# Patient Record
Sex: Female | Born: 1985 | Race: Black or African American | Hispanic: No | Marital: Married | Smoking: Never smoker
Health system: Southern US, Community
[De-identification: ages and names within clinical notes are randomized; demographics above are authoritative.]

## PROBLEM LIST (undated history)

## (undated) ENCOUNTER — Inpatient Hospital Stay (HOSPITAL_COMMUNITY): Payer: Self-pay

## (undated) ENCOUNTER — Ambulatory Visit: Source: Home / Self Care

## (undated) DIAGNOSIS — E739 Lactose intolerance, unspecified: Secondary | ICD-10-CM

## (undated) DIAGNOSIS — D689 Coagulation defect, unspecified: Secondary | ICD-10-CM

## (undated) DIAGNOSIS — E282 Polycystic ovarian syndrome: Secondary | ICD-10-CM

## (undated) DIAGNOSIS — Z91018 Allergy to other foods: Secondary | ICD-10-CM

## (undated) DIAGNOSIS — T7840XA Allergy, unspecified, initial encounter: Secondary | ICD-10-CM

## (undated) DIAGNOSIS — R0602 Shortness of breath: Secondary | ICD-10-CM

## (undated) DIAGNOSIS — A64 Unspecified sexually transmitted disease: Secondary | ICD-10-CM

## (undated) DIAGNOSIS — J45909 Unspecified asthma, uncomplicated: Secondary | ICD-10-CM

## (undated) DIAGNOSIS — M7989 Other specified soft tissue disorders: Secondary | ICD-10-CM

## (undated) DIAGNOSIS — Z789 Other specified health status: Secondary | ICD-10-CM

## (undated) DIAGNOSIS — D219 Benign neoplasm of connective and other soft tissue, unspecified: Secondary | ICD-10-CM

## (undated) DIAGNOSIS — I10 Essential (primary) hypertension: Secondary | ICD-10-CM

## (undated) DIAGNOSIS — R7303 Prediabetes: Secondary | ICD-10-CM

## (undated) DIAGNOSIS — N915 Oligomenorrhea, unspecified: Secondary | ICD-10-CM

## (undated) HISTORY — DX: Allergy to other foods: Z91.018

## (undated) HISTORY — DX: Unspecified sexually transmitted disease: A64

## (undated) HISTORY — DX: Polycystic ovarian syndrome: E28.2

## (undated) HISTORY — DX: Benign neoplasm of connective and other soft tissue, unspecified: D21.9

## (undated) HISTORY — DX: Unspecified asthma, uncomplicated: J45.909

## (undated) HISTORY — DX: Shortness of breath: R06.02

## (undated) HISTORY — DX: Oligomenorrhea, unspecified: N91.5

## (undated) HISTORY — DX: Essential (primary) hypertension: I10

## (undated) HISTORY — DX: Coagulation defect, unspecified: D68.9

## (undated) HISTORY — DX: Other specified soft tissue disorders: M79.89

## (undated) HISTORY — DX: Allergy, unspecified, initial encounter: T78.40XA

## (undated) HISTORY — PX: NO PAST SURGERIES: SHX2092

## (undated) HISTORY — DX: Lactose intolerance, unspecified: E73.9

---
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
## 2005-08-22 ENCOUNTER — Ambulatory Visit: Payer: Self-pay | Admitting: Nurse Practitioner

## 2005-11-14 ENCOUNTER — Ambulatory Visit: Payer: Self-pay | Admitting: Nurse Practitioner

## 2005-12-28 ENCOUNTER — Ambulatory Visit: Payer: Self-pay | Admitting: Nurse Practitioner

## 2006-12-24 ENCOUNTER — Emergency Department (HOSPITAL_COMMUNITY): Admission: EM | Admit: 2006-12-24 | Discharge: 2006-12-24 | Payer: Self-pay | Admitting: Family Medicine

## 2008-03-16 ENCOUNTER — Emergency Department (HOSPITAL_COMMUNITY): Admission: EM | Admit: 2008-03-16 | Discharge: 2008-03-16 | Payer: Self-pay | Admitting: Family Medicine

## 2008-12-18 ENCOUNTER — Emergency Department (HOSPITAL_COMMUNITY): Admission: EM | Admit: 2008-12-18 | Discharge: 2008-12-18 | Payer: Self-pay | Admitting: Emergency Medicine

## 2017-09-12 ENCOUNTER — Inpatient Hospital Stay (HOSPITAL_COMMUNITY)
Admission: AD | Admit: 2017-09-12 | Discharge: 2017-09-12 | Disposition: A | Discharge status: Home / Self Care | Payer: Self-pay | Source: Ambulatory Visit | Attending: Obstetrics & Gynecology | Admitting: Obstetrics & Gynecology

## 2017-09-12 ENCOUNTER — Encounter (HOSPITAL_COMMUNITY): Payer: Self-pay | Admitting: *Deleted

## 2017-09-12 ENCOUNTER — Other Ambulatory Visit: Payer: Self-pay

## 2017-09-12 DIAGNOSIS — N939 Abnormal uterine and vaginal bleeding, unspecified: Secondary | ICD-10-CM | POA: Insufficient documentation

## 2017-09-12 DIAGNOSIS — F17210 Nicotine dependence, cigarettes, uncomplicated: Secondary | ICD-10-CM | POA: Insufficient documentation

## 2017-09-12 DIAGNOSIS — Z8249 Family history of ischemic heart disease and other diseases of the circulatory system: Secondary | ICD-10-CM | POA: Insufficient documentation

## 2017-09-12 DIAGNOSIS — W57XXXA Bitten or stung by nonvenomous insect and other nonvenomous arthropods, initial encounter: Secondary | ICD-10-CM | POA: Insufficient documentation

## 2017-09-12 HISTORY — DX: Other specified health status: Z78.9

## 2017-09-12 MED ORDER — HYDROXYZINE HCL 25 MG PO TABS: 25.0000 mg | ORAL_TABLET | Freq: Four times a day (QID) | ORAL | 0 refills | Status: DC

## 2017-09-12 MED ORDER — TRIAMCINOLONE ACETONIDE 0.1 % EX CREA: 1.0000 "application " | TOPICAL_CREAM | Freq: Two times a day (BID) | CUTANEOUS | 0 refills | Status: DC

## 2017-09-12 MED ORDER — MEGESTROL ACETATE 20 MG PO TABS: 40.0000 mg | ORAL_TABLET | Freq: Two times a day (BID) | ORAL | 0 refills | Status: DC

## 2017-09-12 NOTE — MAU Note (Signed)
While she was out of town, stayed in a hotel, got bit by some bedbug, they just really itch.  On 2nd week of cycle.  No cycle Jan through April.  Neg preg test in March.  Had cycle beginning of May.  States is heavier than her usual.

## 2017-09-12 NOTE — MAU Provider Note (Signed)
History     CSN: [id number]  Arrival date and time: 09/12/17 1349   First Provider Initiated Contact with Patient 09/12/17 1435      Chief Complaint  Patient presents with  . bug bites  . irreg cycle   HPI    Ms.Diamond Thompson is a 32 y.o. non pregnant female here in MAU with complaints of abnormal vaginal  bleeding. Says her menstrual cycle started on  5/24; she says that she has been bleeding ever since. She has had one other period like this about a year ago.  Last Pap was last year and it was normal. She is not on any hormones or BC.   She has been bitten by bug bites while staying in a hotel in [place]. Says the bumps are very itching badly. She tried using OTC hydrocortisone cream which did not help at all. Says the scratching/itching is there all the time. The bites are on her arms and her back.    OB History   None     Past Medical History:  Diagnosis Date  . Medical history non-contributory     Past Surgical History:  Procedure Laterality Date  . NO PAST SURGERIES      Family History  Problem Relation Age of Onset  . Hypertension Mother   . Hypertension Maternal Grandmother   . Hypertension Paternal Grandmother     Social History   Tobacco Use  . Smoking status: Current Every Day Smoker    Types: Cigarettes  . Smokeless tobacco: Never Used  . Tobacco comment: tired   Substance Use Topics  . Alcohol use: Not Currently    Comment: social   . Drug use: Not Currently    Types: Marijuana    Comment: tried at age 32    Allergies:  Allergies  Allergen Reactions  . Kiwi Extract     No medications prior to admission.   No results found for this or any previous visit (from the past 48 hour(s)).   Review of Systems  Gastrointestinal: Negative for abdominal pain.  Genitourinary: Positive for vaginal bleeding.  Neurological: Negative for dizziness.   Physical Exam   Blood pressure 126/87, pulse 90, temperature 98.5 F (36.9 C),  temperature source Oral, resp. rate 18, weight 208 lb 8 oz (94.6 kg), last menstrual period 09/05/2017.  Physical Exam  Constitutional: She is oriented to person, place, and time. She appears well-developed and well-nourished. No distress.  Respiratory: Effort normal.  Genitourinary:  Genitourinary Comments: Vagina - Small amount of dark red blood noted in the vaginal canal Cervix - No contact bleeding, no active bleeding  Bimanual exam: Cervix closed Uterus non tender, slightly enlarged  Chaperone present for exam.   Musculoskeletal: Normal range of motion.  Neurological: She is alert and oriented to person, place, and time.  Skin: Skin is warm. She is not diaphoretic.     Multiple papular bug bites noted on right scapula and bilateral forearms. No surrounding cellulitis   Psychiatric: Her behavior is normal.   MAU Course  Procedures  None  MDM  Declines STD testing. .  Patient declines birth control pills at this time.   Assessment and Plan   A:  1. Abnormal vaginal bleeding   2. Bedbug bite, initial encounter     P:  Discharge home in stable condition Rx: Megace, triamcinolone cream, Atarax  Return to MAU for emergencies  GYN recommended  Wash all of your bedding  Shirlee Whitmire, Artist Pais, NP 09/12/2017  6:15 PM

## 2017-09-12 NOTE — Discharge Instructions (Signed)
Abnormal Uterine Bleeding Abnormal uterine bleeding can affect women at various stages in life, including teenagers, women in their reproductive years, pregnant women, and women who have reached menopause. Several kinds of uterine bleeding are considered abnormal, including:  Bleeding or spotting between periods.  Bleeding after sexual intercourse.  Bleeding that is heavier or more than normal.  Periods that last longer than usual.  Bleeding after menopause. Many cases of abnormal uterine bleeding are minor and simple to treat, while others are more serious. Any type of abnormal bleeding should be evaluated by your health care provider. Treatment will depend on the cause of the bleeding. Follow these instructions at home: Monitor your condition for any changes. The following actions may help to alleviate any discomfort you are experiencing:  Avoid the use of tampons and douches as directed by your health care provider.  Change your pads frequently. You should get regular pelvic exams and Pap tests. Keep all follow-up appointments for diagnostic tests as directed by your health care provider. Contact a health care provider if:  Your bleeding lasts more than 1 week.  You feel dizzy at times. Get help right away if:  You pass out.  You are changing pads every 15 to 30 minutes.  You have abdominal pain.  You have a fever.  You become sweaty or weak.  You are passing large blood clots from the vagina.  You start to feel nauseous and vomit. This information is not intended to replace advice given to you by your health care provider. Make sure you discuss any questions you have with your health care provider. Document Released: 03/27/2005 Document Revised: 09/08/2015 Document Reviewed: 10/24/2012 Elsevier Interactive Patient Education  2017 Elsevier Inc.  

## 2017-09-12 NOTE — Progress Notes (Addendum)
Non-pregn pt here for bleeding past week and ongoing that's heavier than usual monthly bleeding and bedbug bites that's on hands and right back shoulder area. Sunday was when she got the bedbug bites.   1535: Discharge instructions given with pt understanding. Pt left unit via ambulatory with SO.

## 2017-10-23 ENCOUNTER — Other Ambulatory Visit: Payer: Self-pay | Admitting: Family Medicine

## 2017-10-23 DIAGNOSIS — N939 Abnormal uterine and vaginal bleeding, unspecified: Secondary | ICD-10-CM

## 2017-10-24 ENCOUNTER — Ambulatory Visit
Admission: RE | Admit: 2017-10-24 | Discharge: 2017-10-24 | Disposition: A | Discharge status: Home / Self Care | Payer: Self-pay | Source: Ambulatory Visit | Attending: Family Medicine | Admitting: Family Medicine

## 2017-10-24 DIAGNOSIS — N939 Abnormal uterine and vaginal bleeding, unspecified: Secondary | ICD-10-CM

## 2018-05-23 DIAGNOSIS — Z114 Encounter for screening for human immunodeficiency virus [HIV]: Secondary | ICD-10-CM | POA: Diagnosis not present

## 2018-05-23 DIAGNOSIS — Z3202 Encounter for pregnancy test, result negative: Secondary | ICD-10-CM | POA: Diagnosis not present

## 2018-05-23 DIAGNOSIS — Z113 Encounter for screening for infections with a predominantly sexual mode of transmission: Secondary | ICD-10-CM | POA: Diagnosis not present

## 2018-10-17 ENCOUNTER — Other Ambulatory Visit: Payer: Self-pay

## 2018-10-21 ENCOUNTER — Encounter: Payer: Self-pay | Admitting: Certified Nurse Midwife

## 2018-10-21 ENCOUNTER — Other Ambulatory Visit (HOSPITAL_COMMUNITY)
Admission: RE | Admit: 2018-10-21 | Discharge: 2018-10-21 | Disposition: A | Discharge status: Home / Self Care | Payer: BC Managed Care – PPO | Source: Ambulatory Visit | Attending: Certified Nurse Midwife | Admitting: Certified Nurse Midwife

## 2018-10-21 ENCOUNTER — Ambulatory Visit (INDEPENDENT_AMBULATORY_CARE_PROVIDER_SITE_OTHER): Payer: BC Managed Care – PPO | Admitting: Certified Nurse Midwife

## 2018-10-21 ENCOUNTER — Other Ambulatory Visit: Payer: Self-pay

## 2018-10-21 VITALS — BP 118/80 | HR 70 | Temp 97.1°F | Resp 16 | Ht 60.75 in | Wt 204.0 lb

## 2018-10-21 DIAGNOSIS — Z23 Encounter for immunization: Secondary | ICD-10-CM

## 2018-10-21 DIAGNOSIS — Z124 Encounter for screening for malignant neoplasm of cervix: Secondary | ICD-10-CM | POA: Insufficient documentation

## 2018-10-21 DIAGNOSIS — Z01419 Encounter for gynecological examination (general) (routine) without abnormal findings: Secondary | ICD-10-CM

## 2018-10-21 DIAGNOSIS — N926 Irregular menstruation, unspecified: Secondary | ICD-10-CM

## 2018-10-21 DIAGNOSIS — Z86018 Personal history of other benign neoplasm: Secondary | ICD-10-CM

## 2018-10-21 DIAGNOSIS — N852 Hypertrophy of uterus: Secondary | ICD-10-CM

## 2018-10-21 LAB — HCG, SERUM, QUALITATIVE: hCG,Beta Subunit,Qual,Serum: NEGATIVE m[IU]/mL (ref <6)

## 2018-10-21 NOTE — Progress Notes (Signed)
33 y.o. G0P0000 Married  African American Fe here to establish gyn care and  for annual exam. Also has noted that her periods have been more irregular since age 49, with longest time amenorrhea 6 months.Last year periods every 2 weeks with 7 day duration with some heavy bleeding and then light to moderate. This year have been skipping months. Last period was 2 months ago. Was told she had fibroids per health department 6/ 19 at [facility]. (see imaging). Currently sexually active, no contraception 2-3 years. Their choice. UPT at home negative.  No LMP recorded. (Menstrual status: Irregular Periods).          Sexually active: Yes.    The current method of family planning is none.    Exercising: No.  exercise Smoker:  no  Review of Systems  Constitutional: Negative.   HENT: Negative.   Eyes: Negative.   Respiratory: Negative.   Cardiovascular: Negative.   Gastrointestinal: Negative.   Genitourinary: Negative.        Irregular cycle, fibroids  Musculoskeletal: Negative.   Skin: Negative.   Neurological: Negative.   Endo/Heme/Allergies: Negative.   Psychiatric/Behavioral: Negative.     Health Maintenance: Pap:  2019 neg per patient at health dept. History of Abnormal Pap: no MMG:  none Self Breast exams: no Colonoscopy:  none BMD:   none TDaP:  Longer than 10 Shingles: no Pneumonia: no Hep C and HIV: not done Labs: if needed   reports that she has never smoked. She has never used smokeless tobacco. She reports previous alcohol use. She reports previous drug use.  Past Medical History:  Diagnosis Date  . Asthma   . Fibroid   . Medical history non-contributory   . STD (sexually transmitted disease)    age 65?    Past Surgical History:  Procedure Laterality Date  . NO PAST SURGERIES      No current outpatient medications on file.   No current facility-administered medications for this visit.     Family History  Problem Relation Age of Onset  . Hypertension Mother    . Hypertension Maternal Grandmother   . Hypertension Paternal Grandmother     ROS:  Pertinent items are noted in HPI.  Otherwise, a comprehensive ROS was negative.  Exam:   BP 118/80   Pulse 70   Temp (!) 97.1 F (36.2 C) (Skin)   Resp 16   Ht 5' 0.75" (1.543 m)   Wt 204 lb (92.5 kg)   BMI 38.86 kg/m  Height: 5' 0.75" (154.3 cm) Ht Readings from Last 3 Encounters:  10/21/18 5' 0.75" (1.543 m)    General appearance: alert, cooperative and appears stated age Head: Normocephalic, without obvious abnormality, atraumatic Neck: no adenopathy, supple, symmetrical, trachea midline and thyroid normal to inspection and palpation Lungs: clear to auscultation bilaterally Breasts: normal appearance, no masses or tenderness, No nipple retraction or dimpling, No nipple discharge or bleeding, No axillary or supraclavicular adenopathy, pierced nipples bilateral Heart: regular rate and rhythm Abdomen: soft, non-tender; no masses,  no organomegaly Extremities: extremities normal, atraumatic, no cyanosis or edema Skin: Skin color, texture, turgor normal. No rashes or lesions Lymph nodes: Cervical, supraclavicular, and axillary nodes normal. No abnormal inguinal nodes palpated Neurologic: Grossly normal   Pelvic: External genitalia:  no lesions, normal female              Urethra:  normal appearing urethra with no masses, tenderness or lesions  Bartholin's and Skene's: normal                 Vagina: normal appearing vagina with normal color and discharge, no lesions              Cervix: no cervical motion tenderness, no lesions, retroverted and blood present              Pap taken: Yes.   Bimanual Exam:  Uterus:  enlarged, 8-10 week size,  weeks size              Adnexa: normal adnexa and no mass, fullness, tenderness               Rectovaginal: Confirms               Anus:  normal sphincter tone, no lesions  Chaperone present: yes  A:  Well Woman with normal  exam  Contraception none  Irregular periods with DUB  History of Fibroids see PUS 2019  R/O anemia  Immunization update  P:   Reviewed health and wellness pertinent to exam  Discussed irregular period management with OCP, Nuvaring, Nexplanon, ? IUD. Patient not sure she wants to use contraception for management of DUB, will consider. Serum HCG negative here today.  Discussed PUS report reviewed in Epic and feel due to increase in amount of bleeding, uterine possible change would recommend another PUS for  Evaluation. Would then be able to further recommend best option for her.Not planning for pregnancy. Questions addressed at length, patient aware she will be called with insurance information and scheduled.  Discussed lab to evaluate other issues with no period, which can be thyroid or pituitary, not sure she wants other labs.  Discussed concerns with anemia with heavier period and recommend lab evaluation agreeable. Encouraged to eat well with iron foods on daily basis. Warning signs of heavy bleeding discussed and need to advise if occurs. Voiced understanding.  Risks/benefits or Tdap given, requests today.  Lab: CBC  Pap smear: yes   counseled on breast self exam, feminine hygiene, family planning choices, adequate intake of calcium and vitamin D, diet and exercise  return annually or prn  An After Visit Summary was printed and given to the patient.

## 2018-10-22 ENCOUNTER — Other Ambulatory Visit: Payer: Self-pay | Admitting: *Deleted

## 2018-10-22 DIAGNOSIS — N852 Hypertrophy of uterus: Secondary | ICD-10-CM

## 2018-10-22 DIAGNOSIS — N926 Irregular menstruation, unspecified: Secondary | ICD-10-CM

## 2018-10-22 DIAGNOSIS — Z86018 Personal history of other benign neoplasm: Secondary | ICD-10-CM

## 2018-10-22 LAB — CBC
Hematocrit: 40.8 % (ref 34.0–46.6)
Hemoglobin: 12.7 g/dL (ref 11.1–15.9)
MCH: 24.8 pg — ABNORMAL LOW (ref 26.6–33.0)
MCHC: 31.1 g/dL — ABNORMAL LOW (ref 31.5–35.7)
MCV: 80 fL (ref 79–97)
Platelets: 450 10*3/uL (ref 150–450)
RBC: 5.13 x10E6/uL (ref 3.77–5.28)
RDW: 14.9 % (ref 11.7–15.4)
WBC: 6.8 10*3/uL (ref 3.4–10.8)

## 2018-10-23 ENCOUNTER — Encounter: Payer: Self-pay | Admitting: Medical

## 2018-10-23 ENCOUNTER — Ambulatory Visit (INDEPENDENT_AMBULATORY_CARE_PROVIDER_SITE_OTHER): Payer: BC Managed Care – PPO | Admitting: Medical

## 2018-10-23 ENCOUNTER — Other Ambulatory Visit: Payer: Self-pay

## 2018-10-23 VITALS — Ht 63.0 in | Wt 204.0 lb

## 2018-10-23 DIAGNOSIS — J309 Allergic rhinitis, unspecified: Secondary | ICD-10-CM

## 2018-10-23 DIAGNOSIS — Z8709 Personal history of other diseases of the respiratory system: Secondary | ICD-10-CM | POA: Diagnosis not present

## 2018-10-23 LAB — CYTOLOGY - PAP
Diagnosis: NEGATIVE
HPV: NOT DETECTED

## 2018-10-23 MED ORDER — LEVOCETIRIZINE DIHYDROCHLORIDE 5 MG PO TABS: 5.0000 mg | ORAL_TABLET | Freq: Every evening | ORAL | 0 refills | Status: DC

## 2018-10-23 NOTE — Progress Notes (Signed)
   Subjective:    Patient ID: Diamond Thompson, female    DOB: 03/19/1986, 33 y.o.   MRN: [id number]  HPI  Virtual Visit via Video Note  I connected with Diamond Thompson on 10/23/18 at  1:40 PM EDT by a video enabled telemedicine application and verified that I am speaking with the correct person using two identifiers.  Location: Patient: home Provider: home  Pt did not check her bp today.   I discussed the limitations of evaluation and management by telemedicine and the availability of in person appointments. The patient expressed understanding and agreed to proceed.  History of Present Illness:  Pt not having any acute illness.  Pt states no major med problems. Not on meds currently. Pt works at [employer].No children. . Pt does not work out regularly. Pt admits poor diet. Pt drinks coffee twice a week. Nonsmoker. Social drinker. Wine occasional.  Pt up to date on papsmear.  Visit recently with gyn coded as a physical.  Asthma as youth. No recent use. Last inhaler use as teenager.  Does have allergies feels like year round allergies. Spring worse. 2nd season worse fall. Takes claritin.   Observations/Objective: General-no acute distress, pleasant, oriented. Lungs- on inspection lungs appear unlabored. Neck- no tracheal deviation or jvd on inspection. Neuro- gross motor function appears intact.  Assessment and Plan: Nice to meet you today.  For your history of allergic rhinitis which sounds like you might be a year-round, I am making Xyzal antihistamine prescription available.  He could use this instead of Claritin or Zyrtec.  If you find that this is helpful then I let me know and I could prescribe you 90 tablet prescription with 3 refills.  For remote history of asthma as a child, I do not think you need inhaler available presently.  However if you have any wheezing please notify me and I would go ahead and send in albuterol inhaler at that time.  Notify patient  of availability by virtual visit or if needed office visit.  Follow-up as needed.  Follow Up Instructions:    I discussed the assessment and treatment plan with the patient. The patient was provided an opportunity to ask questions and all were answered. The patient agreed with the plan and demonstrated an understanding of the instructions.   The patient was advised to call back or seek an in-person evaluation if the symptoms worsen or if the condition fails to improve as anticipated.  I provided 20 minutes of non-face-to-face time during this encounter.   Mackie Pai, PA-C   Review of Systems Negative review of systems today.    Objective:   Physical Exam        Assessment & Plan:

## 2018-10-23 NOTE — Patient Instructions (Signed)
Nice to meet you today.  For your history of allergic rhinitis which sounds like you might be a year-round, I am making Xyzal antihistamine prescription available.  He could use this instead of Claritin or Zyrtec.  If you find that this is helpful then I let me know and I could prescribe you 90 tablet prescription with 3 refills.  For remote history of asthma as a child, I do not think you need inhaler available presently.  However if you have any wheezing please notify me and I would go ahead and send in albuterol inhaler at that time.  Notify patient of availability by virtual visit or if needed office visit.  Follow-up as needed.

## 2018-10-29 ENCOUNTER — Other Ambulatory Visit: Payer: Self-pay

## 2018-10-31 ENCOUNTER — Ambulatory Visit (INDEPENDENT_AMBULATORY_CARE_PROVIDER_SITE_OTHER): Payer: BC Managed Care – PPO

## 2018-10-31 ENCOUNTER — Other Ambulatory Visit: Payer: Self-pay

## 2018-10-31 ENCOUNTER — Encounter: Payer: Self-pay | Admitting: Obstetrics and Gynecology

## 2018-10-31 ENCOUNTER — Ambulatory Visit (INDEPENDENT_AMBULATORY_CARE_PROVIDER_SITE_OTHER): Payer: BC Managed Care – PPO | Admitting: Obstetrics and Gynecology

## 2018-10-31 VITALS — BP 142/92 | HR 86 | Temp 98.0°F | Resp 14 | Ht 60.75 in | Wt 206.2 lb

## 2018-10-31 DIAGNOSIS — N926 Irregular menstruation, unspecified: Secondary | ICD-10-CM

## 2018-10-31 DIAGNOSIS — N852 Hypertrophy of uterus: Secondary | ICD-10-CM

## 2018-10-31 DIAGNOSIS — Z86018 Personal history of other benign neoplasm: Secondary | ICD-10-CM

## 2018-10-31 DIAGNOSIS — D219 Benign neoplasm of connective and other soft tissue, unspecified: Secondary | ICD-10-CM | POA: Diagnosis not present

## 2018-10-31 DIAGNOSIS — N921 Excessive and frequent menstruation with irregular cycle: Secondary | ICD-10-CM

## 2018-10-31 NOTE — Patient Instructions (Signed)
Endometrial Biopsy  Endometrial biopsy is a procedure in which a tissue sample is taken from inside the uterus. The sample is taken from the endometrium, which is the lining of the uterus. The tissue sample is then checked under a microscope to see if the tissue is normal or abnormal. This procedure helps to determine where you are in your menstrual cycle and how hormone levels are affecting the lining of the uterus. This procedure may also be used to evaluate uterine bleeding or to diagnose endometrial cancer, endometrial tuberculosis, polyps, or other inflammatory conditions. Tell a health care provider about:  Any allergies you have.  All medicines you are taking, including vitamins, herbs, eye drops, creams, and over-the-counter medicines.  Any problems you or family members have had with anesthetic medicines.  Any blood disorders you have.  Any surgeries you have had.  Any medical conditions you have.  Whether you are pregnant or may be pregnant. What are the risks? Generally, this is a safe procedure. However, problems may occur, including:  Bleeding.  Pelvic infection.  Puncture of the wall of the uterus with the biopsy device (rare). What happens before the procedure?  Keep a record of your menstrual cycles as told by your health care provider. You may need to schedule your procedure for a specific time in your cycle.  You may want to bring a sanitary pad to wear after the procedure.  Ask your health care provider about: ? Changing or stopping your regular medicines. This is especially important if you are taking diabetes medicines or blood thinners. ? Taking medicines such as aspirin and ibuprofen. These medicines can thin your blood. Do not take these medicines before your procedure if your health care provider instructs you not to.  Plan to have someone take you home from the hospital or clinic. What happens during the procedure?  To lower your risk of  infection: ? Your health care team will wash or sanitize their hands.  You will lie on an exam table with your feet and legs supported as in a pelvic exam.  Your health care provider will insert an instrument (speculum) into your vagina to see your cervix.  Your cervix will be cleansed with an antiseptic solution.  A medicine (local anesthetic) will be used to numb the cervix.  A forceps instrument (tenaculum) will be used to hold your cervix steady for the biopsy.  A thin, rod-like instrument (uterine sound) will be inserted through your cervix to determine the length of your uterus and the location where the biopsy sample will be removed.  A thin, flexible tube (catheter) will be inserted through your cervix and into the uterus. The catheter will be used to collect the biopsy sample from your endometrial tissue.  The catheter and speculum will then be removed, and the tissue sample will be sent to a lab for examination. What happens after the procedure?  You will rest in a recovery area until you are ready to go home.  You may have mild cramping and a small amount of vaginal bleeding. This is normal.  It is up to you to get the results of your procedure. Ask your health care provider, or the department that is doing the procedure, when your results will be ready. Summary  Endometrial biopsy is a procedure in which a tissue sample is taken from the endometrium, which is the lining of the uterus.  This procedure may help to diagnose menstrual cycle problems, abnormal bleeding, or other conditions affecting   the endometrium.  Before the procedure, keep a record of your menstrual cycles as told by your health care provider.  The tissue sample that is removed will be checked under a microscope to see if it is normal or abnormal. This information is not intended to replace advice given to you by your health care provider. Make sure you discuss any questions you have with your health care  provider. Document Released: 07/28/2004 Document Revised: 03/09/2017 Document Reviewed: 04/12/2016 Elsevier Patient Education  2020 [employer]. Uterine Fibroids  Uterine fibroids (leiomyomas) are noncancerous (benign) tumors that can develop in the uterus. Fibroids may also develop in the fallopian tubes, cervix, or tissues (ligaments) near the uterus. You may have one or many fibroids. Fibroids vary in size, weight, and where they grow in the uterus. Some can become quite large. Most fibroids do not require medical treatment. What are the causes? The cause of this condition is not known. What increases the risk? You are more likely to develop this condition if you:  Are in your 30s or 40s and have not gone through menopause.  Have a family history of this condition.  Are of African-American descent.  Had your first period at an early age (early menarche).  Have not had any children (nulliparity).  Are overweight or obese. What are the signs or symptoms? Many women do not have any symptoms. Symptoms of this condition may include:  Heavy menstrual bleeding.  Bleeding or spotting between periods.  Pain and pressure in the pelvic area, between the hips.  Bladder problems, such as needing to urinate urgently or more often than usual.  Inability to have children (infertility).  Failure to carry pregnancy to term (miscarriage). How is this diagnosed? This condition may be diagnosed based on:  Your symptoms and medical history.  A physical exam.  A pelvic exam that includes feeling for any tumors.  Imaging tests, such as ultrasound or MRI. How is this treated? Treatment for this condition may include:  Seeing your health care provider for follow-up visits to monitor your fibroids for any changes.  Taking NSAIDs such as ibuprofen, naproxen, or aspirin to reduce pain.  Hormone medicines. These may be taken as a pill, given in an injection, or delivered by a T-shaped  device that is inserted into the uterus (intrauterine device, IUD).  Surgery to remove one of the following: ? The fibroids (myomectomy). Your health care provider may recommend this if fibroids affect your fertility and you want to become pregnant. ? The uterus (hysterectomy). ? Blood supply to the fibroids (uterine artery embolization). Follow these instructions at home:  Take over-the-counter and prescription medicines only as told by your health care provider.  Ask your health care provider if you should take iron pills or eat more iron-rich foods, such as dark green, leafy vegetables. Heavy menstrual bleeding can cause low iron levels.  If directed, apply heat to your back or abdomen to reduce pain. Use the heat source that your health care provider recommends, such as a moist heat pack or a heating pad. ? Place a towel between your skin and the heat source. ? Leave the heat on for 20-30 minutes. ? Remove the heat if your skin turns bright red. This is especially important if you are unable to feel pain, heat, or cold. You may have a greater risk of getting burned.  Pay close attention to your menstrual cycle. Tell your health care provider about any changes, such as: ? Increased blood flow that  requires you to use more pads or tampons than usual. ? A change in the number of days that your period lasts. ? A change in symptoms that are associated with your period, such as back pain or cramps in your abdomen.  Keep all follow-up visits as told by your health care provider. This is important, especially if your fibroids need to be monitored for any changes. Contact a health care provider if you:  Have pelvic pain, back pain, or cramps in your abdomen that do not get better with medicine or heat.  Develop new bleeding between periods.  Have increased bleeding during or between periods.  Feel unusually tired or weak.  Feel light-headed. Get help right away if you:  Faint.  Have  pelvic pain that suddenly gets worse.  Have severe vaginal bleeding that soaks a tampon or pad in 30 minutes or less. Summary  Uterine fibroids are noncancerous (benign) tumors that can develop in the uterus.  The exact cause of this condition is not known.  Most fibroids do not require medical treatment unless they affect your ability to have children (fertility).  Contact a health care provider if you have pelvic pain, back pain, or cramps in your abdomen that do not get better with medicines.  Make sure you know what symptoms should cause you to get help right away. This information is not intended to replace advice given to you by your health care provider. Make sure you discuss any questions you have with your health care provider. Document Released: 03/24/2000 Document Revised: 03/09/2017 Document Reviewed: 02/20/2017 Elsevier Patient Education  2020 [employer].

## 2018-10-31 NOTE — Progress Notes (Signed)
GYNECOLOGY  VISIT   HPI: 33 y.o.   Married  [place] American  female   G0P0000 with No LMP recorded. (Menstrual status: Irregular Periods).   here for   Pelvic Ultrasound for uterine enlargement and frequent menses.   Patient of Evalee Mutton.  See 10/21/18 and was noted to have menses every 2 weeks, lasting 7 days.  Uterus was 8 - 10 week size.   Prior LMP was 2 months ago.  Has known fibroids.   States heavy bleeding since age 32 yo.  Menses lasted for 3 weeks this month.  States she has a lot of variation in her bleeding.  She can bleed for an entire month at a time.  States she can skip her menses for 6 months at at time, in 2019. Pad change can be every 30 minutes.  Not usually painful menses, but the last couple of days have been painful. Takes Tylenol prn.   Took birth control for a couple of months last year and her bleeding was controlled for only 2 months.   Not using contraception currently.  She would welcome pregnancy.   Serum hCG negative on 10/21/18. Hgb 12.7 on 10/21/18.    GYNECOLOGIC HISTORY: No LMP recorded. (Menstrual status: Irregular Periods). Contraception:  none Menopausal hormone therapy:  None  Last mammogram:  none Last pap smear:   10-21-2018 negative, HR HPV negative        OB History    Gravida  0   Para  0   Term  0   Preterm  0   AB  0   Living  0     SAB  0   TAB  0   Ectopic  0   Multiple  0   Live Births  0              There are no active problems to display for this patient.   Past Medical History:  Diagnosis Date  . Allergy   . Asthma   . Fibroid   . Medical history non-contributory   . STD (sexually transmitted disease)    age 49?    Past Surgical History:  Procedure Laterality Date  . NO PAST SURGERIES      Current Outpatient Medications  Medication Sig Dispense Refill  . levocetirizine (XYZAL) 5 MG tablet Take 1 tablet (5 mg total) by mouth every evening. 30 tablet 0   No current  facility-administered medications for this visit.      ALLERGIES: Kiwi extract  Family History  Problem Relation Age of Onset  . Hypertension Mother   . Hypertension Maternal Grandmother   . Hypertension Paternal Grandmother     Social History   Socioeconomic History  . Marital status: Married    Spouse name: Not on file  . Number of children: Not on file  . Years of education: high school  . Highest education level: Not on file  Occupational History  . Not on file  Social Needs  . Financial resource strain: Not on file  . Food insecurity    Worry: Not on file    Inability: Not on file  . Transportation needs    Medical: Not on file    Non-medical: Not on file  Tobacco Use  . Smoking status: Never Smoker  . Smokeless tobacco: Never Used  . Tobacco comment: tired   Substance and Sexual Activity  . Alcohol use: Not Currently  . Drug use: Not Currently  . Sexual activity:  Yes    Partners: Male    Birth control/protection: None  Lifestyle  . Physical activity    Days per week: Not on file    Minutes per session: Not on file  . Stress: Not on file  Relationships  . Social Herbalist on phone: Not on file    Gets together: Not on file    Attends religious service: Not on file    Active member of club or organization: Not on file    Attends meetings of clubs or organizations: Not on file    Relationship status: Not on file  . Intimate partner violence    Fear of current or ex partner: Not on file    Emotionally abused: Not on file    Physically abused: Not on file    Forced sexual activity: Not on file  Other Topics Concern  . Not on file  Social History Narrative  . Not on file    Review of Systems  Constitutional: Negative.   HENT: Negative.   Eyes: Negative.   Respiratory: Negative.   Cardiovascular: Negative.   Gastrointestinal: Negative.   Endocrine: Negative.   Genitourinary: Negative.   Musculoskeletal: Negative.   Skin: Negative.    Allergic/Immunologic: Negative.   Neurological: Negative.   Hematological: Negative.   Psychiatric/Behavioral: Negative.     PHYSICAL EXAMINATION:    BP (!) 142/92 (BP Location: Right Arm, Patient Position: Sitting, Cuff Size: Large)   Pulse 86   Temp 98 F (36.7 C) (Temporal)   Resp 14   Ht 5' 0.75" (1.543 m)   Wt 206 lb 4 oz (93.6 kg)   BMI 39.29 kg/m     General appearance: alert, cooperative and appears stated age   Pelvic US Uterus with 4 fibroids - 3.0, 3.2, 1.5, 1.8 cm.  EMS 7.51 mm.  Right ovarian follicle.  Left ovary normal.  No free fluid.  Chaperone was present for exam.  ASSESSMENT  Menorrhagia with irregular menses - long standing.  Periods of amenorrhea. Fibroids.  Elevated BP today.  PLAN  We dicussed her bleeding and possible etiologies - anovulation, fibroids, hyperplasia, and even malignancy.  Fibroids discussed in detail. Return for EMB.  She will abstain for 2 weeks prior to EMB. Will check TSH and recheck CBC today due to her ongoing bleeding.  She and her partner will decide if they are interested to pursue pregnancy or not as this will affect the treatment choice for her bleeding.  Start PNV.    An After Visit Summary was printed and given to the patient.  __15____ minutes face to face time of which over 50% was spent in counseling.      ADDENDUM; MULTIPLE FIBROIDS, LARGEST - 32 MM MEAN DOMINANT FOLLICLE RT OVARY - 17 MM MEAN

## 2018-11-01 ENCOUNTER — Telehealth: Payer: Self-pay | Admitting: Obstetrics and Gynecology

## 2018-11-01 LAB — CBC
Hematocrit: 37.5 % (ref 34.0–46.6)
Hemoglobin: 11.6 g/dL (ref 11.1–15.9)
MCH: 24.7 pg — ABNORMAL LOW (ref 26.6–33.0)
MCHC: 30.9 g/dL — ABNORMAL LOW (ref 31.5–35.7)
MCV: 80 fL (ref 79–97)
Platelets: 460 10*3/uL — ABNORMAL HIGH (ref 150–450)
RBC: 4.7 x10E6/uL (ref 3.77–5.28)
RDW: 15 % (ref 11.7–15.4)
WBC: 5.8 10*3/uL (ref 3.4–10.8)

## 2018-11-01 LAB — TSH: TSH: 1.59 u[IU]/mL (ref 0.450–4.500)

## 2018-11-01 NOTE — Telephone Encounter (Signed)
Call placed to convey benefits for endometrial biopsy. Spoke with patient she understands/agreeable with the benefits. Patient wants to schedule appointment in two weeks please call patient for scheduling.

## 2018-11-05 NOTE — Telephone Encounter (Signed)
Spoke with patient. No contraceptive. LMP: menses started 1 month ago, still on menses. Has not been SA since OV on 10/31/18. Advised to continue to abstain from intercourse until EMB completed. EMB scheduled for 8/6 at 3:30pm with Dr. Quincy Simmonds. Advised to take Motrin 800 mg with food and water one hour before procedure. Patient verbalizes understanding.   Routing to provider for final review. Patient is agreeable to disposition. Will close encounter.  Cc: Magdalene Patricia

## 2018-11-11 ENCOUNTER — Other Ambulatory Visit: Payer: Self-pay

## 2018-11-11 DIAGNOSIS — Z20822 Contact with and (suspected) exposure to covid-19: Secondary | ICD-10-CM

## 2018-11-11 DIAGNOSIS — R6889 Other general symptoms and signs: Secondary | ICD-10-CM | POA: Diagnosis not present

## 2018-11-12 ENCOUNTER — Telehealth: Payer: Self-pay | Admitting: Obstetrics and Gynecology

## 2018-11-12 NOTE — Telephone Encounter (Signed)
Spoke with patient. LMP approximately 10/02/18, bleeding has not stopped. Describes flow as light to spotting. Patient has not been SA since 7/23. Advised to continue to abstain from intercourse until after EMB. Procedure rescheduled to 8/10 at 8am with Dr. Quincy Simmonds. Advised to take Motrin 800 mg with food and water one hour before procedure.   Routing to provider for final review. Patient is agreeable to disposition. Will close encounter.  Cc: Lerry Liner, Magdalene Patricia

## 2018-11-12 NOTE — Telephone Encounter (Signed)
Patient cancelled EMB for Thursday and would like a call to reschedule.

## 2018-11-13 LAB — NOVEL CORONAVIRUS, NAA: SARS-CoV-2, NAA: NOT DETECTED

## 2018-11-14 ENCOUNTER — Ambulatory Visit: Payer: Self-pay | Admitting: Obstetrics and Gynecology

## 2018-11-14 NOTE — Progress Notes (Signed)
GYNECOLOGY  VISIT   HPI: 33 y.o.   Married  [place] American  female   [place] with [name] was 10/14/2018 (approximate).   here for endometrial biopsy.    She has prolonged bleeding and then can skip her cycles for months.   Took Ibroprofen 200 mg prior to procedure.   Last intercourse was 2 days ago and used condoms.   LMP started 10/14/18 and stopped on 11/14/18.   She and her husband have decided to prevent pregnancy for now.   UPT negative.  GYNECOLOGIC HISTORY: [name] was 10/14/2018 (approximate). Contraception:  None Menopausal hormone therapy:  none Last mammogram:  none Last pap smear: 10-21-18 Neg:Neg HR HPV        OB History    Gravida  0   Para  0   Term  0   Preterm  0   AB  0   Living  0     SAB  0   TAB  0   Ectopic  0   Multiple  0   Live Births  0              There are no active problems to display for this patient.   Past Medical History:  Diagnosis Date  . Allergy   . Asthma   . Fibroid   . Medical history non-contributory   . STD (sexually transmitted disease)    age 35?    Past Surgical History:  Procedure Laterality Date  . NO PAST SURGERIES      Current Outpatient Medications  Medication Sig Dispense Refill  . levocetirizine (XYZAL) 5 MG tablet Take 1 tablet (5 mg total) by mouth every evening. (Patient not taking: Reported on 11/18/2018) 30 tablet 0   No current facility-administered medications for this visit.      ALLERGIES: Kiwi extract  Family History  Problem Relation Age of Onset  . Hypertension Mother   . Hypertension Maternal Grandmother   . Hypertension Paternal Grandmother     Social History   Socioeconomic History  . Marital status: Married    Spouse name: Not on file  . Number of children: Not on file  . Years of education: high school  . Highest education level: Not on file  Occupational History  . Not on file  Social Needs  . Financial  resource strain: Not on file  . Food insecurity    Worry: Not on file    Inability: Not on file  . Transportation needs    Medical: Not on file    Non-medical: Not on file  Tobacco Use  . Smoking status: Never Smoker  . Smokeless tobacco: Never Used  . Tobacco comment: tired   Substance and Sexual Activity  . Alcohol use: Not Currently  . Drug use: Not Currently  . Sexual activity: Yes    Partners: Male    Birth control/protection: None  Lifestyle  . Physical activity    Days per week: Not on file    Minutes per session: Not on file  . Stress: Not on file  Relationships  . Social Herbalist on phone: Not on file    Gets together: Not on file    Attends religious service: Not on file    Active member of club or organization: Not on file    Attends meetings of clubs or organizations: Not on file    Relationship status: Not on file  . Intimate  partner violence    Fear of current or ex partner: Not on file    Emotionally abused: Not on file    Physically abused: Not on file    Forced sexual activity: Not on file  Other Topics Concern  . Not on file  Social History Narrative  . Not on file    Review of Systems  All other systems reviewed and are negative.   PHYSICAL EXAMINATION:    BP 132/90   Pulse 70   Temp 97.6 F (36.4 C)   Ht 5' 0.75" (1.543 m)   Wt 204 lb 6.4 oz (92.7 kg)   LMP 10/14/2018 (Approximate)   BMI 38.94 kg/m     General appearance: alert, cooperative and appears stated age   EMB: Consent for procedure. Sterile prep with  Hibiclens Paracervical block with 1% lidocaine 6 cc, lot number  [id number]     , expiration 10/09/19 Tenaculum to anterior cervical lip. Pipelle passed to     8     cm twice.   Tissue to pathology.  Minimal EBL. No complications.  Silver nitrate used on anterior cervical lip tenaculum site.  Chaperone was present for exam.  ASSESSMENT  Menorrhagia with irregular menses.  Periods of amenorrhea.  Declines  childbearing currently. Elevated blood pressure.  Fibroid uterus - one abutting against endometrium on [place].   PLAN  EMB precautions.  Will contact patient with result. We discussed progesterone based options for care - Micronor, Depo Provera, Nexplanon, and Mirena IUD.  She prefers Depo Provera - risks and benefits reviewed.  She may benefit from a prolactin level, which I do not see on chart review after her visit is completed.  She can do this at her Depo Provera appointment.    An After Visit Summary was printed and given to the patient.  __15____ minutes face to face time of which over 50% was spent in counseling.

## 2018-11-18 ENCOUNTER — Other Ambulatory Visit: Payer: Self-pay

## 2018-11-18 ENCOUNTER — Encounter: Payer: Self-pay | Admitting: Obstetrics and Gynecology

## 2018-11-18 ENCOUNTER — Ambulatory Visit (INDEPENDENT_AMBULATORY_CARE_PROVIDER_SITE_OTHER): Payer: BC Managed Care – PPO | Admitting: Obstetrics and Gynecology

## 2018-11-18 DIAGNOSIS — N921 Excessive and frequent menstruation with irregular cycle: Secondary | ICD-10-CM | POA: Diagnosis not present

## 2018-11-18 LAB — POCT URINE PREGNANCY: Preg Test, Ur: NEGATIVE

## 2018-11-18 NOTE — Patient Instructions (Signed)

## 2018-11-18 NOTE — Addendum Note (Signed)
Addended by: Yisroel Ramming, Dietrich Pates E on: 11/18/2018 03:11 PM   Modules accepted: Orders

## 2018-11-25 ENCOUNTER — Other Ambulatory Visit: Payer: Self-pay

## 2018-11-25 DIAGNOSIS — Z20822 Contact with and (suspected) exposure to covid-19: Secondary | ICD-10-CM

## 2018-11-26 LAB — NOVEL CORONAVIRUS, NAA: SARS-CoV-2, NAA: NOT DETECTED

## 2018-12-03 ENCOUNTER — Other Ambulatory Visit: Payer: Self-pay

## 2018-12-06 ENCOUNTER — Ambulatory Visit: Payer: BC Managed Care – PPO

## 2018-12-09 ENCOUNTER — Ambulatory Visit (INDEPENDENT_AMBULATORY_CARE_PROVIDER_SITE_OTHER): Payer: BC Managed Care – PPO

## 2018-12-09 ENCOUNTER — Other Ambulatory Visit: Payer: Self-pay

## 2018-12-09 VITALS — BP 124/82 | HR 82 | Temp 96.8°F | Ht 60.75 in | Wt 204.6 lb

## 2018-12-09 DIAGNOSIS — Z30013 Encounter for initial prescription of injectable contraceptive: Secondary | ICD-10-CM

## 2018-12-09 DIAGNOSIS — N921 Excessive and frequent menstruation with irregular cycle: Secondary | ICD-10-CM | POA: Diagnosis not present

## 2018-12-09 LAB — POCT URINE PREGNANCY: Preg Test, Ur: NEGATIVE

## 2018-12-09 MED ORDER — MEDROXYPROGESTERONE ACETATE 150 MG/ML IM SUSP
150.0000 mg | Freq: Once | INTRAMUSCULAR | Status: AC
  Administered 2018-12-09: 11:00:00 150 mg via INTRAMUSCULAR

## 2018-12-09 NOTE — Progress Notes (Signed)
Patient is here for Initial Depo Provera Injection Next Depo Due between: 02/24/2019 - 03/10/2019 Last AEX: 10/21/2018 AEX Scheduled: none  Patient is aware when next depo is due.  Pt tolerated Injection well.  Routed to provider for review, encounter closed.

## 2018-12-10 LAB — PROLACTIN: Prolactin: 9.2 ng/mL (ref 4.8–23.3)

## 2019-01-15 IMAGING — US US PELVIS COMPLETE TRANSABD/TRANSVAG
1 series · 13 of 25 positions shown · non-contrast
Comparison: None

CLINICAL DATA: Initial evaluation for vaginal bleeding for 1 year.
Question uterine fibroids.



[Series 1: us pelvis complete transabd/transvag · 0.31mm/px · 13 of 88 slices shown]
[im 1/88]
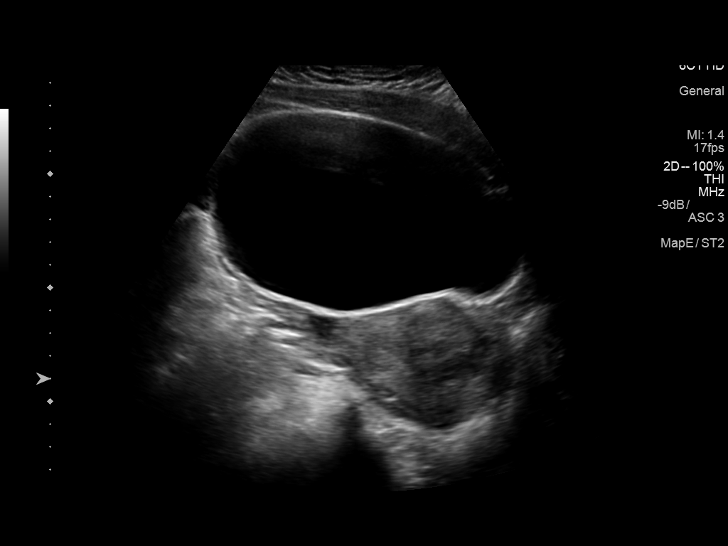
[im 8/88]
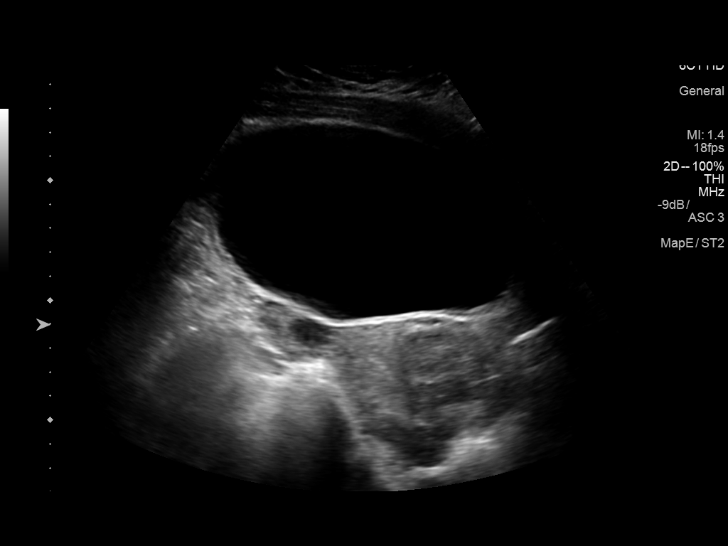
[im 15/88]
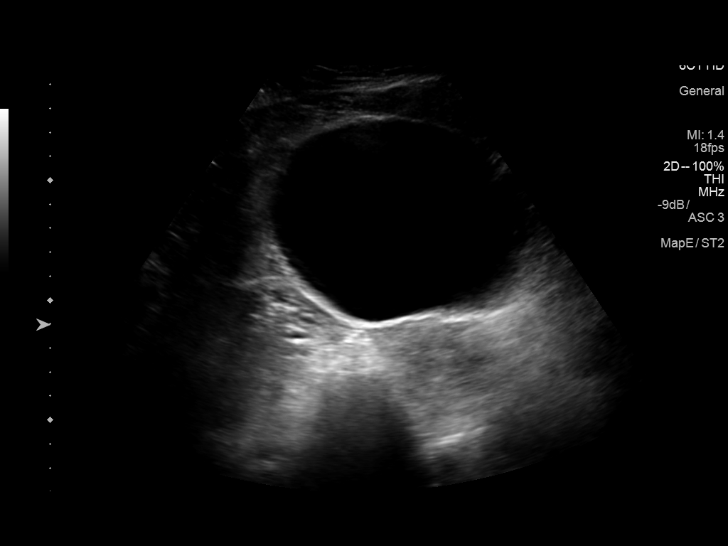
[im 22/88]
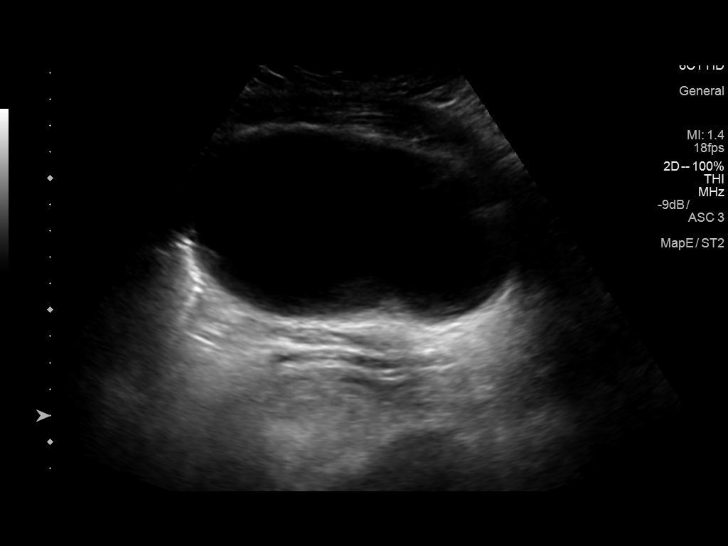
[im 30/88]
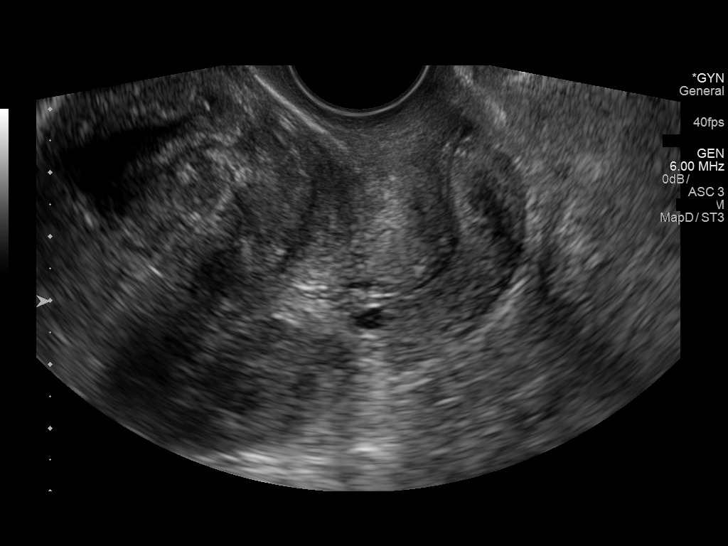
[im 37/88]
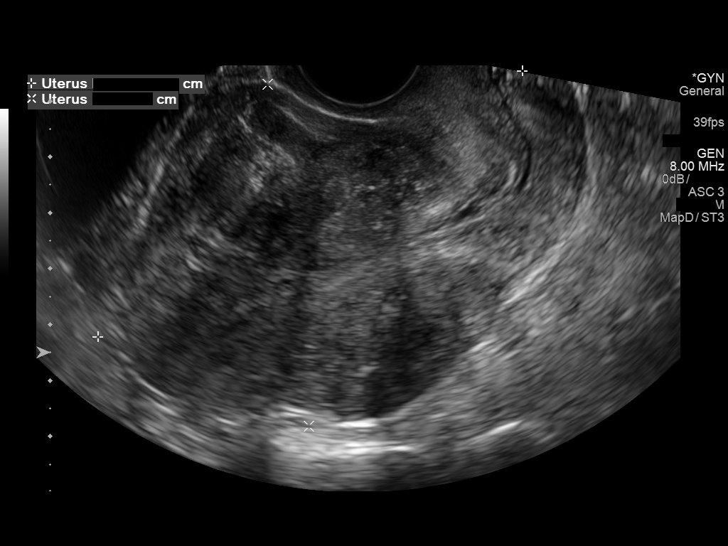
[im 44/88]
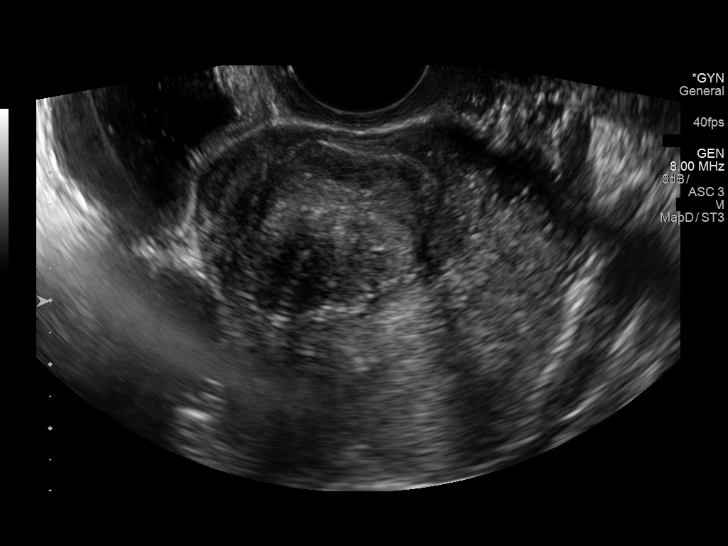
[im 51/88]
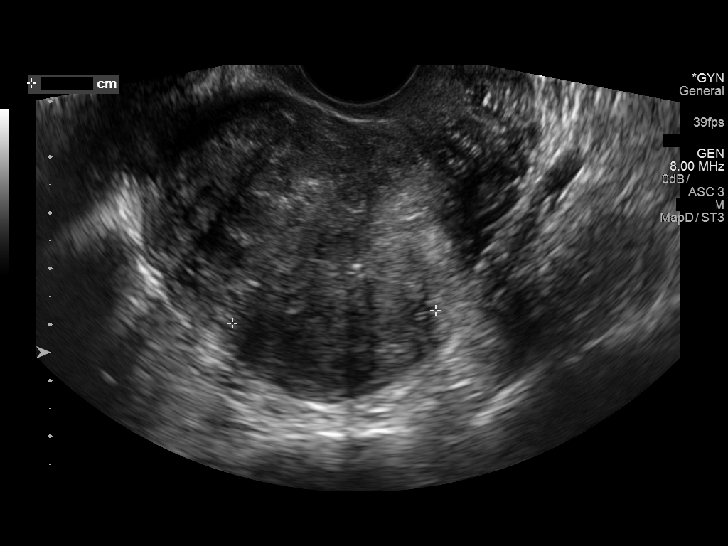
[im 59/88]
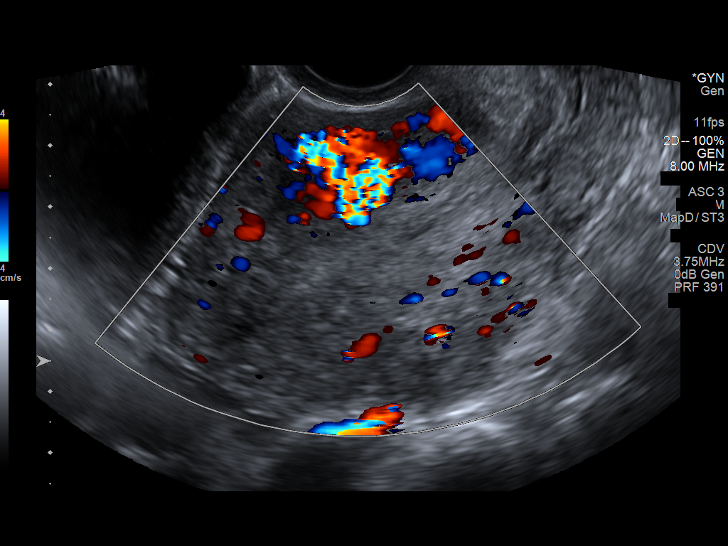
[im 66/88]
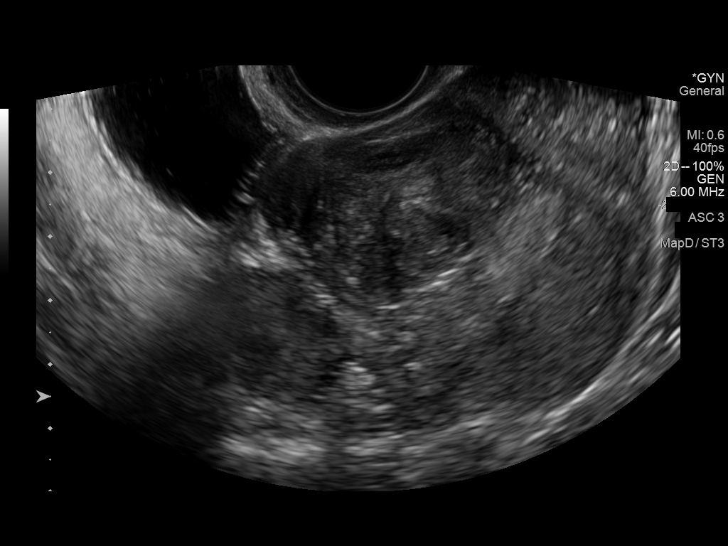
[im 73/88]
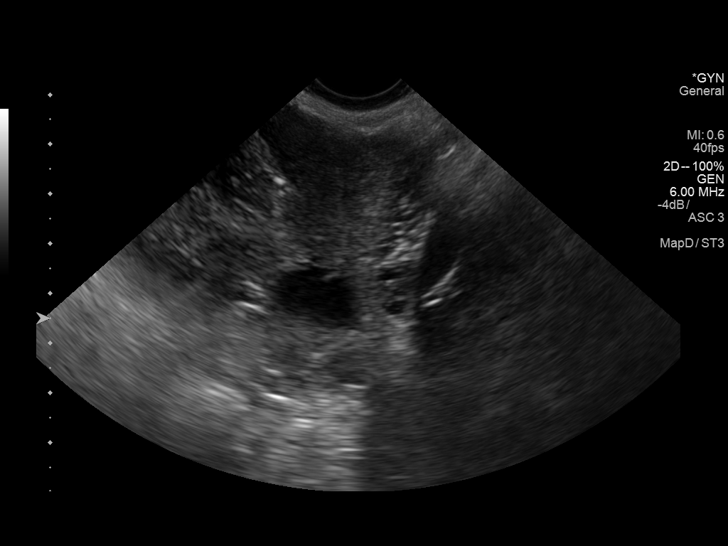
[im 80/88]
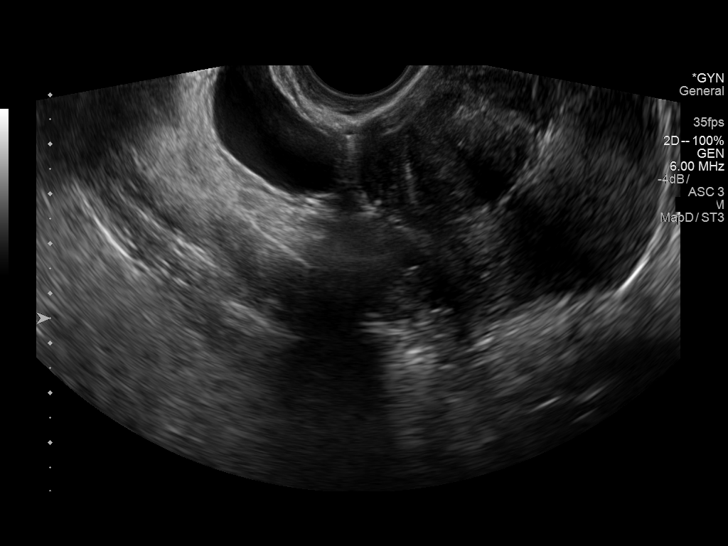
[im 88/88]
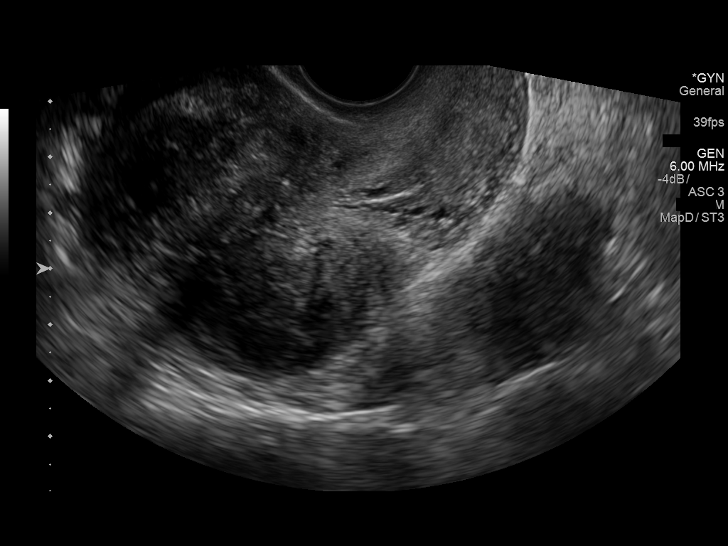

[13 of 25 positions shown; findings below may reference images not displayed]

FINDINGS: Uterus

Measurements: 8.5 x 5.1 x 6.7 cm. Uterine myometrium demonstrates a
heterogeneous echotexture. 3.0 x 2.6 x 3.8 cm intramural fibroid
present at the anterior right uterine fundus/body. 3.6 x 2.7 x
cm intramural fibroid present at the left posterior uterine body.
Nabothian cyst noted at the cervix.

Endometrium

Thickness: 4 mm.  No focal abnormality visualized.

Right ovary

Measurements: 2.5 x 1.3 x 1.9 cm. Normal appearance/no adnexal mass.

Left ovary

Measurements: 2.8 x 2.2 x 2.3 cm. 1.9 cm cyst, most likely a small
physiologic cyst/dominant follicle. No other adnexal mass.

Other findings

No abnormal free fluid.
IMPRESSION: 1. Fibroid uterus as above.
2. Endometrial stripe within normal limits measuring 4 mm in
thickness.
3. No other acute abnormality within the pelvis.

## 2019-01-24 ENCOUNTER — Encounter: Payer: Self-pay | Admitting: Obstetrics and Gynecology

## 2019-01-24 ENCOUNTER — Telehealth: Payer: Self-pay | Admitting: Obstetrics and Gynecology

## 2019-01-24 NOTE — Telephone Encounter (Signed)
Patient sent the following message through [place]. Routing to triage to assist patient with request.  [place]  Phone Number: [phone]        Hello, Rene Paci been on my birth control almost 73mos just letting you know the September I had no cycle. However I did start spotting 10/5-6 and fully on the 7th. I'm currently still on my cycle and it's been heavy the past 3days now and bad cramps. Is this a normal process?

## 2019-01-24 NOTE — Telephone Encounter (Signed)
Call to patient. First depo shot given on 12-09-2018. Patient states she did not have a cycle during the month of September. States she started spotting on 10/5 which turned in to a full period on 10/7. Patient states the last 3 days of her cycle have been really heavy. States she is going through a pad every 1.5 hours. Having some blood clots 2x/day the size of a half dollar. Feels better once she passes a blood clot. Complaining of cramping pain 7/10. Not currently taking any OTC pain medication. States she is soaking in the tub which provides relief. Denies fever, urinary symptoms, vaginal odor or anemia symptoms. RN advised OV recommended for further evaluation. Patient requesting Tuesday or Friday appointment. Appointment scheduled for 01-28-2019 at 1100. Pain/fever/bleeding precautions reviewed with patient and when to seek UC or ER care and patient verbalized understanding.   Routing to provider and will close encounter.

## 2019-01-28 ENCOUNTER — Encounter: Payer: Self-pay | Admitting: Obstetrics and Gynecology

## 2019-01-28 ENCOUNTER — Ambulatory Visit (INDEPENDENT_AMBULATORY_CARE_PROVIDER_SITE_OTHER): Payer: No Typology Code available for payment source | Admitting: Obstetrics and Gynecology

## 2019-01-28 ENCOUNTER — Other Ambulatory Visit: Payer: Self-pay

## 2019-01-28 VITALS — BP 128/74 | Temp 97.4°F | Resp 16 | Ht 60.75 in | Wt 204.2 lb

## 2019-01-28 DIAGNOSIS — Z3042 Encounter for surveillance of injectable contraceptive: Secondary | ICD-10-CM | POA: Diagnosis not present

## 2019-01-28 DIAGNOSIS — D219 Benign neoplasm of connective and other soft tissue, unspecified: Secondary | ICD-10-CM

## 2019-01-28 DIAGNOSIS — N926 Irregular menstruation, unspecified: Secondary | ICD-10-CM

## 2019-01-28 LAB — POCT URINE PREGNANCY: Preg Test, Ur: NEGATIVE

## 2019-01-28 MED ORDER — MEDROXYPROGESTERONE ACETATE 150 MG/ML IM SUSP
150.0000 mg | Freq: Once | INTRAMUSCULAR | Status: AC
  Administered 2019-01-28: 150 mg via INTRAMUSCULAR

## 2019-01-28 NOTE — Progress Notes (Signed)
GYNECOLOGY  VISIT   HPI: 33 y.o.   Married  [place] American  female   G0P0000 with No LMP recorded. (Menstrual status: Irregular Periods).   here for irregular bleeding x 3 weeks with cramps/clots and heavy bleeding. Patient states began Depo Provera injections 2 months ago.    No bleeding at all in September.   States she is having bad menstrual cramping.  Up at night due to pain.  She is taking a hot bath or shower.  Not taking pain medication.  Changing a pad every 1.5 hours for 3 days in a row.   She denies dizziness or lightheadedness.  Has cold spells.   Patient has a hx of menorrhagia and irregular menses.  Patient has known fibroids by US done 10/31/18.  Largest in 3.2 cm  EMS was 7.51 mm.  Normal ovaries.   EMB 11/18/18 showed disordered proliferative endometrium   She wants to delay childbearing.   UPT: neg  GYNECOLOGIC HISTORY: No LMP recorded. (Menstrual status: Irregular Periods). Contraception:  Depo Provera Menopausal hormone therapy:  n/a Last mammogram:  n/a Last pap smear: 10-21-18 Neg:Neg HR HPV         OB History    Gravida  0   Para  0   Term  0   Preterm  0   AB  0   Living  0     SAB  0   TAB  0   Ectopic  0   Multiple  0   Live Births  0              There are no active problems to display for this patient.   Past Medical History:  Diagnosis Date  . Allergy   . Asthma   . Fibroid   . Medical history non-contributory   . STD (sexually transmitted disease)    age 72?    Past Surgical History:  Procedure Laterality Date  . NO PAST SURGERIES      Current Outpatient Medications  Medication Sig Dispense Refill  . medroxyPROGESTERone (DEPO-PROVERA) 150 MG/ML injection Inject 150 mg into the muscle every 3 (three) months.     No current facility-administered medications for this visit.      ALLERGIES: Kiwi extract  Family History  Problem Relation Age of Onset  . Hypertension Mother   . Hypertension  Maternal Grandmother   . Hypertension Paternal Grandmother     Social History   Socioeconomic History  . Marital status: Married    Spouse name: Not on file  . Number of children: Not on file  . Years of education: high school  . Highest education level: Not on file  Occupational History  . Not on file  Social Needs  . Financial resource strain: Not on file  . Food insecurity    Worry: Not on file    Inability: Not on file  . Transportation needs    Medical: Not on file    Non-medical: Not on file  Tobacco Use  . Smoking status: Never Smoker  . Smokeless tobacco: Never Used  . Tobacco comment: tired   Substance and Sexual Activity  . Alcohol use: Not Currently  . Drug use: Not Currently  . Sexual activity: Yes    Partners: Male    Birth control/protection: None  Lifestyle  . Physical activity    Days per week: Not on file    Minutes per session: Not on file  . Stress: Not on file  Relationships  . Social Herbalist on phone: Not on file    Gets together: Not on file    Attends religious service: Not on file    Active member of club or organization: Not on file    Attends meetings of clubs or organizations: Not on file    Relationship status: Not on file  . Intimate partner violence    Fear of current or ex partner: Not on file    Emotionally abused: Not on file    Physically abused: Not on file    Forced sexual activity: Not on file  Other Topics Concern  . Not on file  Social History Narrative  . Not on file    Review of Systems  All other systems reviewed and are negative.   PHYSICAL EXAMINATION:    BP 128/74 (Cuff Size: Large)   Temp (!) 97.4 F (36.3 C) (Temporal)   Resp 16   Ht 5' 0.75" (1.543 m)   Wt 204 lb 3.2 oz (92.6 kg)   BMI 38.90 kg/m     General appearance: alert, cooperative and appears stated age  Pelvic: External genitalia:  no lesions              Urethra:  normal appearing urethra with no masses, tenderness or  lesions              Bartholins and Skenes: normal                 Vagina: normal appearing vagina with normal color and discharge, no lesions              Cervix: no lesions                Bimanual Exam:  Uterus:  8 - 9 week size with anterior LUS fibroid noted.               Adnexa: no mass, fullness, tenderness             Chaperone was present for exam.  ASSESSMENT  Symptomatic fibroids.  Depo Provera monitoring.   PLAN  Declines CBC. Start MVI with iron.  Give Depo Provera 150 mg IM today.  It is ok that this is early.  Continue Depo Provera every 12 weeks following this.  FU prn.    An After Visit Summary was printed and given to the patient.  __15____ minutes face to face time of which over 50% was spent in counseling.

## 2019-02-21 ENCOUNTER — Ambulatory Visit: Payer: BC Managed Care – PPO | Admitting: Obstetrics and Gynecology

## 2019-02-24 ENCOUNTER — Ambulatory Visit: Payer: BC Managed Care – PPO

## 2019-04-15 ENCOUNTER — Other Ambulatory Visit: Payer: Self-pay

## 2019-04-15 ENCOUNTER — Ambulatory Visit (INDEPENDENT_AMBULATORY_CARE_PROVIDER_SITE_OTHER): Payer: No Typology Code available for payment source | Admitting: Obstetrics and Gynecology

## 2019-04-15 ENCOUNTER — Encounter: Payer: Self-pay | Admitting: Obstetrics and Gynecology

## 2019-04-15 VITALS — BP 130/80 | HR 80 | Temp 97.9°F | Ht 61.0 in | Wt 205.6 lb

## 2019-04-15 DIAGNOSIS — Z3042 Encounter for surveillance of injectable contraceptive: Secondary | ICD-10-CM

## 2019-04-15 NOTE — Progress Notes (Signed)
Patient is here for Depo Provera Injection Patient is within Depo Provera Calender Limits 04/15/2019-04/29/2019  Pt states having BTB since last Depo injection on 01/28/2019 at OV with Dr Quincy Simmonds. Pt states taking sister's OCPs for last 3 weeks and has stopped BTB. Pt has many questions about Depo and OCPs. Pt not given Depo today. Pt scheduled to discuss with Dr Quincy Simmonds at [facility] on 04/24/2019 at 3:30pm. Pt agreeable.

## 2019-04-24 ENCOUNTER — Ambulatory Visit (INDEPENDENT_AMBULATORY_CARE_PROVIDER_SITE_OTHER): Payer: No Typology Code available for payment source | Admitting: Obstetrics and Gynecology

## 2019-04-24 ENCOUNTER — Other Ambulatory Visit (HOSPITAL_COMMUNITY)
Admission: RE | Admit: 2019-04-24 | Discharge: 2019-04-24 | Disposition: A | Discharge status: Home / Self Care | Payer: No Typology Code available for payment source | Source: Ambulatory Visit | Attending: Obstetrics and Gynecology | Admitting: Obstetrics and Gynecology

## 2019-04-24 ENCOUNTER — Encounter: Payer: Self-pay | Admitting: Obstetrics and Gynecology

## 2019-04-24 ENCOUNTER — Other Ambulatory Visit: Payer: Self-pay

## 2019-04-24 VITALS — BP 140/92 | Temp 96.7°F | Ht 60.75 in | Wt 204.8 lb

## 2019-04-24 DIAGNOSIS — N921 Excessive and frequent menstruation with irregular cycle: Secondary | ICD-10-CM | POA: Diagnosis not present

## 2019-04-24 DIAGNOSIS — N930 Postcoital and contact bleeding: Secondary | ICD-10-CM | POA: Diagnosis not present

## 2019-04-24 DIAGNOSIS — D219 Benign neoplasm of connective and other soft tissue, unspecified: Secondary | ICD-10-CM

## 2019-04-24 DIAGNOSIS — Z113 Encounter for screening for infections with a predominantly sexual mode of transmission: Secondary | ICD-10-CM | POA: Diagnosis not present

## 2019-04-24 DIAGNOSIS — N941 Unspecified dyspareunia: Secondary | ICD-10-CM

## 2019-04-24 LAB — POCT URINE PREGNANCY: Preg Test, Ur: NEGATIVE

## 2019-04-24 MED ORDER — ESTRADIOL 1 MG PO TABS: 1.0000 mg | ORAL_TABLET | Freq: Every day | ORAL | 0 refills | Status: DC

## 2019-04-24 MED ORDER — MEDROXYPROGESTERONE ACETATE 150 MG/ML IM SUSP
150.0000 mg | Freq: Once | INTRAMUSCULAR | Status: AC
  Administered 2019-04-24: 17:00:00 150 mg via INTRAMUSCULAR

## 2019-04-24 NOTE — Progress Notes (Signed)
GYNECOLOGY  VISIT   HPI: 34 y.o.   Married  [place] American  female   [place] with [name] was 04/18/2019 (exact date).   here for irregular spotting between cycles on Depo Provera. Patient took her sister's OCPs x1 month while on Depo.     Last Depo Provera not given on 04/15/19 due to the above.  Started Depo Provera in August.  She has a hx of menorrhagia and irregular menses. She has 4 fibroids (3 cm, 3.2 cm, 1.5 cm, 1.8 cm) and had an EMB showing disordered proliferative endometrium.   She had been having post coital bleeding. Having pain with intercourse for a few months.  Pain is positional.  Dryness is not an issue.  UPT - negative.  GYNECOLOGIC HISTORY: [name] was 04/18/2019 (exact date). Contraception:  Depo Provera Menopausal hormone therapy:  n/a Last mammogram:  n/a Last pap smear:  10-21-18 Neg:Neg HR HPV         OB History    Gravida  0   Para  0   Term  0   Preterm  0   AB  0   Living  0     SAB  0   TAB  0   Ectopic  0   Multiple  0   Live Births  0              There are no problems to display for this patient.   Past Medical History:  Diagnosis Date  . Allergy   . Asthma   . Fibroid   . Medical history non-contributory   . STD (sexually transmitted disease)    age 72?    Past Surgical History:  Procedure Laterality Date  . NO PAST SURGERIES      Current Outpatient Medications  Medication Sig Dispense Refill  . medroxyPROGESTERone (DEPO-PROVERA) 150 MG/ML injection Inject 150 mg into the muscle every 3 (three) months.     No current facility-administered medications for this visit.     ALLERGIES: Kiwi extract  Family History  Problem Relation Age of Onset  . Hypertension Mother   . Hypertension Maternal Grandmother   . Hypertension Paternal Grandmother     Social History   Socioeconomic History  . Marital status: Married    Spouse name: Not on file  . Number  of children: Not on file  . Years of education: high school  . Highest education level: Not on file  Occupational History  . Not on file  Tobacco Use  . Smoking status: Never Smoker  . Smokeless tobacco: Never Used  . Tobacco comment: tired   Substance and Sexual Activity  . Alcohol use: Not Currently  . Drug use: Not Currently  . Sexual activity: Yes    Partners: Male    Birth control/protection: None  Other Topics Concern  . Not on file  Social History Narrative  . Not on file   Social Determinants of Health   Financial Resource Strain:   . Difficulty of Paying Living Expenses: Not on file  Food Insecurity:   . Worried About Charity fundraiser in the Last Year: Not on file  . Ran Out of Food in the Last Year: Not on file  Transportation Needs:   . Lack of Transportation (Medical): Not on file  . Lack of Transportation (Non-Medical): Not on file  Physical Activity:   . Days of Exercise per Week: Not on file  . Minutes of  Exercise per Session: Not on file  Stress:   . Feeling of Stress : Not on file  Social Connections:   . Frequency of Communication with Friends and Family: Not on file  . Frequency of Social Gatherings with Friends and Family: Not on file  . Attends Religious Services: Not on file  . Active Member of Clubs or Organizations: Not on file  . Attends Archivist Meetings: Not on file  . Marital Status: Not on file  Intimate Partner Violence:   . Fear of Current or Ex-Partner: Not on file  . Emotionally Abused: Not on file  . Physically Abused: Not on file  . Sexually Abused: Not on file    Review of Systems  All other systems reviewed and are negative.   PHYSICAL EXAMINATION:    BP (!) 140/92 (Cuff Size: Large)   Temp (!) 96.7 F (35.9 C) (Temporal)   Ht 5' 0.75" (1.543 m)   Wt 204 lb 12.8 oz (92.9 kg)   LMP 04/18/2019 (Exact Date)   BMI 39.02 kg/m     General appearance: alert, cooperative and appears stated age  Pelvic:  External genitalia:  no lesions              Urethra:  normal appearing urethra with no masses, tenderness or lesions              Bartholins and Skenes: normal                 Vagina: normal appearing vagina with normal color and discharge, no lesions              Cervix: no lesions.  Mild menstrual flow noted.                Bimanual Exam:  Uterus:  LUS fibroid.  Tender uterus.              Adnexa: no mass, fullness, tenderness            Chaperone was present for exam.  ASSESSMENT  Depo Provera surveillance.  Breakthrough bleeding.  Post coital bleeding. Dyspareunia. Fibroid in LUS that is immediately adjacent to the endometrial canal. Elevated blood pressure with taking her sisters OCPs.   PLAN  She will continue her Depo Provera. Stop COC.  Start Estrace 1 mg.  STD screening.  FU in 3 months.   An After Visit Summary was printed and given to the patient.  __20____ minutes face to face time of which over 50% was spent in counseling.

## 2019-04-25 ENCOUNTER — Telehealth: Payer: Self-pay | Admitting: Obstetrics and Gynecology

## 2019-04-25 LAB — HEP, RPR, HIV PANEL
HIV Screen 4th Generation wRfx: NONREACTIVE
Hepatitis B Surface Ag: NEGATIVE
RPR Ser Ql: NONREACTIVE

## 2019-04-25 LAB — HEPATITIS C ANTIBODY: Hep C Virus Ab: <0.1 s/co ratio (ref 0.0–0.9)

## 2019-04-25 NOTE — Telephone Encounter (Signed)
Left a message for patient to call and schedule a 3 month follow up appointment due 07/24/19.

## 2019-04-27 DIAGNOSIS — D251 Intramural leiomyoma of uterus: Secondary | ICD-10-CM | POA: Insufficient documentation

## 2019-04-27 DIAGNOSIS — D219 Benign neoplasm of connective and other soft tissue, unspecified: Secondary | ICD-10-CM | POA: Insufficient documentation

## 2019-04-28 LAB — CERVICOVAGINAL ANCILLARY ONLY
Chlamydia: NEGATIVE
Comment: NEGATIVE
Comment: NEGATIVE
Comment: NORMAL
Neisseria Gonorrhea: NEGATIVE
Trichomonas: NEGATIVE

## 2019-05-16 ENCOUNTER — Other Ambulatory Visit: Payer: Self-pay | Admitting: Obstetrics and Gynecology

## 2019-05-16 NOTE — Telephone Encounter (Signed)
Medication refill request: estrace 1mg  Last AEX:  10-21-2018 Next OV: 07-23-2019 13mth f/u of medication Last MMG (if hormonal medication request): none Refill authorized: please approve 29mth supply is appropriate

## 2019-06-19 ENCOUNTER — Telehealth: Payer: Self-pay | Admitting: *Deleted

## 2019-06-19 NOTE — Telephone Encounter (Signed)
All to patient. Per DPR can leave message on cell number.  Calling to check on follow-up plan for next depo injection. Due to 3 month follow-up in April and next injection on 06-30-19.  Will need to call RX to pharmacy and patient bring medication to office.

## 2019-06-27 ENCOUNTER — Encounter: Payer: Self-pay | Admitting: Certified Nurse Midwife

## 2019-06-30 ENCOUNTER — Ambulatory Visit: Payer: No Typology Code available for payment source

## 2019-06-30 NOTE — Telephone Encounter (Signed)
Patient did return call or show for appointment today. Has 3 month follow-up appointment scheduled for 07-23-19.   Routing to provider for review. Encounter closed.

## 2019-06-30 NOTE — Telephone Encounter (Signed)
Call to patient regarding Depo appointment scheduled for this am. Depo is not actually due to till 07-10-19 based on last injection date.  Due to insurance benefits, new prescription will need to be sent to pharmacy and patient will need to bring medication to office for administration. Left message to call back.

## 2019-07-21 ENCOUNTER — Telehealth: Payer: Self-pay | Admitting: Obstetrics and Gynecology

## 2019-07-21 NOTE — Telephone Encounter (Signed)
Left message on voicemail to call and reschedule cancelled appointment. °

## 2019-07-23 ENCOUNTER — Ambulatory Visit: Payer: No Typology Code available for payment source | Admitting: Obstetrics and Gynecology

## 2020-06-28 ENCOUNTER — Ambulatory Visit
Admission: EM | Admit: 2020-06-28 | Discharge: 2020-06-28 | Disposition: A | Discharge status: Home / Self Care | Payer: No Typology Code available for payment source | Attending: Family Medicine | Admitting: Family Medicine

## 2020-06-28 ENCOUNTER — Other Ambulatory Visit: Payer: Self-pay

## 2020-06-28 DIAGNOSIS — M25511 Pain in right shoulder: Secondary | ICD-10-CM

## 2020-06-28 DIAGNOSIS — M79661 Pain in right lower leg: Secondary | ICD-10-CM

## 2020-06-28 DIAGNOSIS — R0789 Other chest pain: Secondary | ICD-10-CM

## 2020-06-28 DIAGNOSIS — M7918 Myalgia, other site: Secondary | ICD-10-CM

## 2020-06-28 MED ORDER — TIZANIDINE HCL 4 MG PO TABS: 4.0000 mg | ORAL_TABLET | Freq: Four times a day (QID) | ORAL | 0 refills | Status: DC | PRN

## 2020-06-28 MED ORDER — PREDNISONE 20 MG PO TABS: 40.0000 mg | ORAL_TABLET | Freq: Every day | ORAL | 0 refills | Status: DC
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
## 2020-09-22 ENCOUNTER — Encounter: Payer: Self-pay | Admitting: Emergency Medicine

## 2020-09-22 ENCOUNTER — Ambulatory Visit
Admission: EM | Admit: 2020-09-22 | Discharge: 2020-09-22 | Disposition: A | Discharge status: Home / Self Care | Payer: 59 | Attending: Emergency Medicine | Admitting: Emergency Medicine

## 2020-09-22 ENCOUNTER — Other Ambulatory Visit: Payer: Self-pay

## 2020-09-22 DIAGNOSIS — N898 Other specified noninflammatory disorders of vagina: Secondary | ICD-10-CM

## 2020-09-22 DIAGNOSIS — R300 Dysuria: Secondary | ICD-10-CM | POA: Diagnosis not present

## 2020-09-22 LAB — POCT URINALYSIS DIP (MANUAL ENTRY)
Bilirubin, UA: NEGATIVE
Glucose, UA: NEGATIVE mg/dL
Ketones, POC UA: NEGATIVE mg/dL
Nitrite, UA: NEGATIVE
Protein Ur, POC: 30 mg/dL — AB
Spec Grav, UA: 1.02 (ref 1.010–1.025)
Urobilinogen, UA: 0.2 E.U./dL
pH, UA: 6 (ref 5.0–8.0)

## 2020-09-22 LAB — POCT URINE PREGNANCY: Preg Test, Ur: NEGATIVE

## 2020-09-22 MED ORDER — FLUCONAZOLE 150 MG PO TABS: 150.0000 mg | ORAL_TABLET | Freq: Once | ORAL | 0 refills | Status: AC

## 2020-09-22 NOTE — Discharge Instructions (Addendum)
Urine culture and vaginal swab pending to further evaluate for UTI and any vaginal infections Begin 1 tablet of Diflucan today, may repeat in 72 hours if still having symptoms and swab positive for yeast We will call with results and provide further treatment if needed Please follow-up for any concerns or any persistent or worsening symptoms

## 2020-09-22 NOTE — ED Provider Notes (Signed)
[facility]    CSN: [id number] Arrival date & time: 09/22/20  1623      History   Chief Complaint Chief Complaint  Patient presents with   Dysuria    HPI Lakeena Downie is a 35 y.o. female history of asthma presenting today for evaluation of possible UTI.  Reports dysuria over the past 3 to 4 days.  Reports that she has also had associated vaginal discharge and itching.  Reports initially thought related to yeast infection and used over-the-counter medicines without relief.  Denies any urinary frequency or urgency.  Denies significant history of UTIs.  Reports irregular menstrual cycles.  HPI  Past Medical History:  Diagnosis Date   Allergy    Asthma    Fibroid    Medical history non-contributory    STD (sexually transmitted disease)    age 64?    Patient Active Problem List   Diagnosis Date Noted   Fibroids 04/27/2019    Past Surgical History:  Procedure Laterality Date   NO PAST SURGERIES      OB History     Gravida  0   Para  0   Term  0   Preterm  0   AB  0   Living  0      SAB  0   IAB  0   Ectopic  0   Multiple  0   Live Births  0            Home Medications    Prior to Admission medications   Medication Sig Start Date End Date Taking? Authorizing Provider  fluconazole (DIFLUCAN) 150 MG tablet Take 1 tablet (150 mg total) by mouth once for 1 dose. 09/22/20 09/22/20 Yes Cheryll Keisler, Elesa Hacker, PA-C    Family History Family History  Problem Relation Age of Onset   Hypertension Mother    Hypertension Maternal Grandmother    Hypertension Paternal Grandmother     Social History Social History   Tobacco Use   Smoking status: Never   Smokeless tobacco: Never   Tobacco comments:    tired   Substance Use Topics   Alcohol use: Not Currently   Drug use: Not Currently     Allergies   Kiwi extract   Review of Systems Review of Systems  Constitutional:  Negative for fever.  Respiratory:  Negative for  shortness of breath.   Cardiovascular:  Negative for chest pain.  Gastrointestinal:  Negative for abdominal pain, diarrhea, nausea and vomiting.  Genitourinary:  Positive for dysuria and vaginal discharge. Negative for flank pain, genital sores, hematuria, menstrual problem, vaginal bleeding and vaginal pain.  Musculoskeletal:  Negative for back pain.  Skin:  Negative for rash.  Neurological:  Negative for dizziness, light-headedness and headaches.    Physical Exam Triage Vital Signs ED Triage Vitals  Enc Vitals Group     BP 09/22/20 1703 (!) 155/98     Pulse Rate 09/22/20 1703 91     Resp 09/22/20 1703 18     Temp 09/22/20 1703 98.4 F (36.9 C)     Temp Source 09/22/20 1703 Oral     SpO2 09/22/20 1703 99 %     Weight --      Height --      Head Circumference --      Peak Flow --      Pain Score 09/22/20 1704 3     Pain Loc --      Pain Edu? --  Excl. in [place]? --    No data found.  Updated Vital Signs BP (!) 155/98 (BP Location: Left Arm)   Pulse 91   Temp 98.4 F (36.9 C) (Oral)   Resp 18   SpO2 99%   Visual Acuity Right Eye Distance:   Left Eye Distance:   Bilateral Distance:    Right Eye Near:   Left Eye Near:    Bilateral Near:     Physical Exam Vitals and nursing note reviewed.  Constitutional:      Appearance: She is well-developed.     Comments: No acute distress  HENT:     Head: Normocephalic and atraumatic.     Nose: Nose normal.  Eyes:     Conjunctiva/sclera: Conjunctivae normal.  Cardiovascular:     Rate and Rhythm: Normal rate.  Pulmonary:     Effort: Pulmonary effort is normal. No respiratory distress.  Abdominal:     General: There is no distension.  Musculoskeletal:        General: Normal range of motion.     Cervical back: Neck supple.  Skin:    General: Skin is warm and dry.  Neurological:     Mental Status: She is alert and oriented to person, place, and time.     UC Treatments / Results  Labs (all labs ordered are  listed, but only abnormal results are displayed) Labs Reviewed  POCT URINALYSIS DIP (MANUAL ENTRY) - Abnormal; Notable for the following components:      Result Value   Blood, UA trace-intact (*)    Protein Ur, POC =30 (*)    Leukocytes, UA Small (1+) (*)    All other components within normal limits  URINE CULTURE  POCT URINE PREGNANCY  CERVICOVAGINAL ANCILLARY ONLY    EKG   Radiology No results found.  Procedures Procedures (including critical care time)  Medications Ordered in UC Medications - No data to display  Initial Impression / Assessment and Plan / UC Course  I have reviewed the triage vital signs and the nursing notes.  Pertinent labs & imaging results that were available during my care of the patient were reviewed by me and considered in my medical decision making (see chart for details).     Dysuria/itching/discharge-UA with small leuks, trace hemoglobin, urine culture pending to further evaluate for UTI, given clinical symptoms more suspicious of underlying vaginitis contributing to dysuria, empirically treating for yeast, will will await results of culture and vaginal swab and provide further treatment as needed.  Discussed strict return precautions. Patient verbalized understanding and is agreeable with plan.  Final Clinical Impressions(s) / UC Diagnoses   Final diagnoses:  Dysuria  Vaginal discharge     Discharge Instructions      Urine culture and vaginal swab pending to further evaluate for UTI and any vaginal infections Begin 1 tablet of Diflucan today, may repeat in 72 hours if still having symptoms and swab positive for yeast We will call with results and provide further treatment if needed Please follow-up for any concerns or any persistent or worsening symptoms     ED Prescriptions     Medication Sig Dispense Auth. Provider   fluconazole (DIFLUCAN) 150 MG tablet Take 1 tablet (150 mg total) by mouth once for 1 dose. 2 tablet Reet Scharrer,  [place] C, PA-C      PDMP not reviewed this encounter.   Janith Lima, PA-C 09/22/20 1801

## 2020-09-22 NOTE — ED Triage Notes (Signed)
Pt sts some dysuria x 3-4 days

## 2020-09-23 LAB — CERVICOVAGINAL ANCILLARY ONLY
Bacterial Vaginitis (gardnerella): NEGATIVE
Candida Glabrata: NEGATIVE
Candida Vaginitis: NEGATIVE
Chlamydia: NEGATIVE
Comment: NEGATIVE
Comment: NEGATIVE
Comment: NEGATIVE
Comment: NEGATIVE
Comment: NEGATIVE
Comment: NORMAL
Neisseria Gonorrhea: NEGATIVE
Trichomonas: NEGATIVE

## 2020-09-24 LAB — URINE CULTURE: Culture: <10000 — AB

## 2021-07-20 ENCOUNTER — Encounter: Payer: Self-pay | Admitting: Emergency Medicine

## 2021-07-20 ENCOUNTER — Emergency Department
Admission: EM | Admit: 2021-07-20 | Discharge: 2021-07-20 | Disposition: A | Discharge status: Home / Self Care | Payer: 59 | Attending: Student in an Organized Health Care Education/Training Program | Admitting: Student in an Organized Health Care Education/Training Program

## 2021-07-20 ENCOUNTER — Other Ambulatory Visit: Payer: Self-pay

## 2021-07-20 ENCOUNTER — Emergency Department: Payer: 59

## 2021-07-20 DIAGNOSIS — K5900 Constipation, unspecified: Secondary | ICD-10-CM | POA: Insufficient documentation

## 2021-07-20 DIAGNOSIS — J45909 Unspecified asthma, uncomplicated: Secondary | ICD-10-CM | POA: Insufficient documentation

## 2021-07-20 DIAGNOSIS — R1084 Generalized abdominal pain: Secondary | ICD-10-CM | POA: Insufficient documentation

## 2021-07-20 DIAGNOSIS — R030 Elevated blood-pressure reading, without diagnosis of hypertension: Secondary | ICD-10-CM | POA: Insufficient documentation

## 2021-07-20 LAB — URINALYSIS, ROUTINE W REFLEX MICROSCOPIC
Bilirubin Urine: NEGATIVE
Glucose, UA: NEGATIVE mg/dL
Hgb urine dipstick: NEGATIVE
Ketones, ur: NEGATIVE mg/dL
Leukocytes,Ua: NEGATIVE
Nitrite: NEGATIVE
Protein, ur: 100 mg/dL — AB
Specific Gravity, Urine: 1.016 (ref 1.005–1.030)
pH: 5 (ref 5.0–8.0)

## 2021-07-20 LAB — COMPREHENSIVE METABOLIC PANEL
ALT: 18 U/L (ref 0–44)
AST: 23 U/L (ref 15–41)
Albumin: 4.2 g/dL (ref 3.5–5.0)
Alkaline Phosphatase: 114 U/L (ref 38–126)
Anion gap: 9 (ref 5–15)
BUN: 7 mg/dL (ref 6–20)
CO2: 22 mmol/L (ref 22–32)
Calcium: 9.2 mg/dL (ref 8.9–10.3)
Chloride: 105 mmol/L (ref 98–111)
Creatinine, Ser: 0.7 mg/dL (ref 0.44–1.00)
GFR, Estimated: >60 mL/min (ref >60)
Glucose, Bld: 110 mg/dL — ABNORMAL HIGH (ref 70–99)
Potassium: 3.4 mmol/L — ABNORMAL LOW (ref 3.5–5.1)
Sodium: 136 mmol/L (ref 135–145)
Total Bilirubin: 0.9 mg/dL (ref 0.3–1.2)
Total Protein: 8.2 g/dL — ABNORMAL HIGH (ref 6.5–8.1)

## 2021-07-20 LAB — CBC
HCT: 45.1 % (ref 36.0–46.0)
Hemoglobin: 14.3 g/dL (ref 12.0–15.0)
MCH: 24.3 pg — ABNORMAL LOW (ref 26.0–34.0)
MCHC: 31.7 g/dL (ref 30.0–36.0)
MCV: 76.6 fL — ABNORMAL LOW (ref 80.0–100.0)
Platelets: 313 10*3/uL (ref 150–400)
RBC: 5.89 MIL/uL — ABNORMAL HIGH (ref 3.87–5.11)
RDW: 13.2 % (ref 11.5–15.5)
WBC: 7.1 10*3/uL (ref 4.0–10.5)
nRBC: 0 % (ref 0.0–0.2)

## 2021-07-20 LAB — LIPASE, BLOOD: Lipase: 23 U/L (ref 11–51)

## 2021-07-20 LAB — POC URINE PREG, ED: Preg Test, Ur: NEGATIVE

## 2021-07-20 NOTE — ED Triage Notes (Signed)
Pt via POV from home. Pt c/o generalized abd pain. Denies NVD. Pt states she think she is lactose intolerant, pt states she think her coffee had the wrong type of milk in it. States that her stomach started hurting after she drank the coffee and hasn't had a BM since then. Pt is A&OX4 and NAD ?

## 2021-07-20 NOTE — ED Provider Notes (Signed)
? ?[facility] ?Provider Note ? ? ? Event Date/Time  ? First MD Initiated Contact with Patient 07/20/21 [phone]   ?  (approximate) ? ? ?History  ? ?Abdominal Pain ? ? ?HPI ? ?Diamond Thompson is a 36 y.o. female   presents to the ED with complaint of generalized abdominal pain that began yesterday approximately 2 hours after she drank some coffee that she believes had the wrong type of milk in it.  Patient thinks that she is lactose intolerant.  She states her stomach started hurting after she drank coffee.  She also complains of constipation since yesterday and states that her normal daily routine is 2 bowel movements per day.  Patient took Tylenol yesterday afternoon without any relief of her pain.  She has also drank an herbal tea to see if this would help with her feelings of constipation.  She denies any fever, chills, nausea or vomiting.  Patient also has a history of allergies, asthma and fibroids. ? ?  ? ? ?Physical Exam  ? ?Triage Vital Signs: ?ED Triage Vitals  ?Enc Vitals Group  ?   BP 07/20/21 0831 (!) 163/103  ?   Pulse Rate 07/20/21 0831 82  ?   Resp 07/20/21 0831 20  ?   Temp 07/20/21 0831 98 ?F (36.7 ?C)  ?   Temp Source 07/20/21 0831 Oral  ?   SpO2 07/20/21 0831 95 %  ?   Weight 07/20/21 0829 200 lb (90.7 kg)  ?   Height 07/20/21 0829 '5\' 1"'  (1.549 m)  ?   Head Circumference --   ?   Peak Flow --   ?   Pain Score 07/20/21 0829 7  ?   Pain Loc --   ?   Pain Edu? --   ?   Excl. in [place]? --   ? ? ?Most recent vital signs: ?Vitals:  ? 07/20/21 0831 07/20/21 0945  ?BP: (!) 163/103 (!) 161/114  ?Pulse: 82 80  ?Resp: 20 18  ?Temp: 98 ?F (36.7 ?C)   ?SpO2: 95% 99%  ? ? ? ?General: Awake, no distress.  ?CV:  Good peripheral perfusion.  Heart regular rate and rhythm. ?Resp:  Normal effort. Lungs are clear bilaterally. ?Abd:  No distention.  Soft, minimal tenderness noted epigastric area.  No point tenderness on palpation.  No referred pain.  Bowel sounds are normoactive x4  quadrants. ?Other:   ? ? ?ED Results / Procedures / Treatments  ? ?Labs ?(all labs ordered are listed, but only abnormal results are displayed) ?Labs Reviewed  ?COMPREHENSIVE METABOLIC PANEL - Abnormal; Notable for the following components:  ?    Result Value  ? Potassium 3.4 (*)   ? Glucose, Bld 110 (*)   ? Total Protein 8.2 (*)   ? All other components within normal limits  ?CBC - Abnormal; Notable for the following components:  ? RBC 5.89 (*)   ? MCV 76.6 (*)   ? MCH 24.3 (*)   ? All other components within normal limits  ?URINALYSIS, ROUTINE W REFLEX MICROSCOPIC - Abnormal; Notable for the following components:  ? Color, Urine YELLOW (*)   ? APPearance HAZY (*)   ? Protein, ur 100 (*)   ? Bacteria, UA MANY (*)   ? All other components within normal limits  ?LIPASE, BLOOD  ?POC URINE PREG, ED  ? ? ? ? ?RADIOLOGY ?1 view abdomen image was reviewed by myself independently of the radiologist and noted to have  moderate amount of gas and small stool burden.  No evidence of an ileus.  X-ray report confirms no evidence of an obstruction and small to moderate stool burden present. ? ? ? ?PROCEDURES: ? ?Critical Care performed:  ? ?Procedures ? ? ?MEDICATIONS ORDERED IN ED: ?Medications - No data to display ? ? ?IMPRESSION / MDM / ASSESSMENT AND PLAN / ED COURSE  ?I reviewed the triage vital signs and the nursing notes. ? ? ?Differential diagnosis includes, but is not limited to, constipation, abdominal pain, viral gastritis. ? ? ?36 year old female presents to the ED with complaint of abdominal discomfort that is generalized.  Patient states that she believes to be lactose intolerant believes that her coffee had the wrong type of milk in it.  She states that since she drank the coffee she has been unable to have a bowel movement since that time.  Patient denied any nausea, vomiting or diarrhea.  It was generalized tenderness on palpation but bowel sounds were present throughout.  Urinalysis was reassuring and pregnancy  test was negative.  Met C was unremarkable and CBC showed a white count of 7.1.  1 view abdomen showed moderate amount of gas but no pattern that suggested obstruction.  Patient was instructed to obtain Dulcolax suppositories and also continue drinking her herbal tea.  She is to follow-up with her PCP if any continued problems. ? ? ?  ? ? ?FINAL CLINICAL IMPRESSION(S) / ED DIAGNOSES  ? ?Final diagnoses:  ?Generalized abdominal pain  ?Constipation, unspecified constipation type  ?Elevated blood pressure reading  ? ? ? ?Rx / DC Orders  ? ?ED Discharge Orders   ? ? None  ? ?  ? ? ? ?Note:  This document was prepared using Dragon voice recognition software and may include unintentional dictation errors. ?  ?Johnn Hai, PA-C ?07/20/21 1251 ? ?  ?Merlyn Lot, MD ?07/20/21 1355 ? ?

## 2021-07-20 NOTE — Discharge Instructions (Addendum)
Follow-up with your primary care provider or urgent care if any continued problems.  If any severe worsening of your symptoms return to the emergency department.  Also follow-up with your primary care or urgent care to have your blood pressure rechecked.  Today in the emergency department was elevated at 163/103.  If there is a family history of hypertension you should have this evaluated. ?Today on your x-ray there was a very small amount of stool but more gas in your abdomen which could be causing your abdominal pain.  You may continue drinking your tea but also consider obtaining some Dulcolax suppositories which are over-the-counter. ?

## 2021-10-17 ENCOUNTER — Ambulatory Visit: Admit: 2021-10-17 | Discharge status: Absent without leave | Payer: 59

## 2021-11-26 ENCOUNTER — Encounter (INDEPENDENT_AMBULATORY_CARE_PROVIDER_SITE_OTHER): Payer: Self-pay

## 2021-12-05 ENCOUNTER — Encounter: Payer: Self-pay | Admitting: Family Medicine

## 2021-12-05 ENCOUNTER — Ambulatory Visit: Payer: 59 | Admitting: Family Medicine

## 2021-12-05 VITALS — BP 154/99 | HR 77 | Temp 97.8°F | Ht 60.5 in | Wt 203.0 lb

## 2021-12-05 DIAGNOSIS — D219 Benign neoplasm of connective and other soft tissue, unspecified: Secondary | ICD-10-CM | POA: Diagnosis not present

## 2021-12-05 DIAGNOSIS — Z23 Encounter for immunization: Secondary | ICD-10-CM

## 2021-12-05 DIAGNOSIS — I10 Essential (primary) hypertension: Secondary | ICD-10-CM | POA: Diagnosis not present

## 2021-12-05 HISTORY — DX: Essential (primary) hypertension: I10

## 2021-12-05 NOTE — Patient Instructions (Signed)
Check blood pressure 3 times a week

## 2021-12-05 NOTE — Progress Notes (Addendum)
Assessment/Plan:  Spent 40 minutes reviewing patient's chart and discussing history as below At today's visit, we discussed treatment options, associated risk and benefits, and engage in counseling as needed.  Additionally the following were reviewed: Past medical records, past medical and surgical history, family and social background, as well as relevant laboratory results, imaging findings, and specialty notes, where applicable.  This message was generated using dictation software, and as a result, it may contain unintentional typos or errors.  Nevertheless, extensive effort was made to accurately convey at the pertinent aspects of the patient visit.    There may have been are other unrelated non-urgent complaints, but due to the busy schedule and the amount of time already spent with her, time does not permit to address these issues at today's visit. Another appointment may have or has been requested to review these additional issues.  Problem List Items Addressed This Visit       Cardiovascular and Mediastinum   Primary hypertension    Uncontrolled Check home numbers Follow-up 1 month, recommend getting restratification labs at that time        Other   Fibroids   Relevant Orders   Ambulatory referral to Obstetrics / Gynecology   Other Visit Diagnoses     Need for influenza vaccination    -  Primary   Relevant Orders   Flu Vaccine QUAD 6+ mos PF IM (Fluarix Quad PF) (Completed)          Subjective:  HPI:  Diamond Thompson is a 36 y.o. female who has Fibroids and Primary hypertension on their problem list..   She  has a past medical history of Allergy, Asthma, Fibroid, Medical history non-contributory, Primary hypertension (12/05/2021), and STD (sexually transmitted disease)..   She presents with chief complaint of Establish Care (B/p concerns. Patient has been running high. Non fasting. Referral to OBGYN ) .   Hypertension, new to patient, although appears to  be long standing problem problem,  BP Readings from Last 3 Encounters:  12/05/21 (!) 154/99  07/20/21 (!) 161/114  09/22/20 (!) 155/98   Home BP monitoring: none, although can check Current Medications: none, patient interested in lifestyle modificatoins for management Interim History: Patient elevated for abdominal pain at emergency department in 07/2021, CMP showed low normal potassium at 3.4, remainder of electrolytes and creatinine were normal.  CBC was without anemia,, UA did not show any proteinuria but was positive for bacteria, lipase within normal limits, urine pregnancy test negative  ROS: Denies any chest pain, shortness of breath, dyspnea on exertion, leg edema.    Patient reports history of fibroids.  She would like to follow-up with OB/GYN.   Past Surgical History:  Procedure Laterality Date   NO PAST SURGERIES      No outpatient medications prior to visit.   No facility-administered medications prior to visit.    Family History  Problem Relation Age of Onset   Hypertension Mother    Hypertension Maternal Grandmother    Hypertension Paternal Grandmother     Social History   Socioeconomic History   Marital status: Married    Spouse name: Not on file   Number of children: Not on file   Years of education: high school   Highest education level: Not on file  Occupational History   Not on file  Tobacco Use   Smoking status: Never   Smokeless tobacco: Never   Tobacco comments:    tired   Substance and Sexual Activity  Alcohol use: Not Currently   Drug use: Not Currently   Sexual activity: Yes    Partners: Male    Birth control/protection: None  Other Topics Concern   Not on file  Social History Narrative   Not on file   Social Determinants of Health   Financial Resource Strain: Not on file  Food Insecurity: Not on file  Transportation Needs: Not on file  Physical Activity: Not on file  Stress: Not on file  Social Connections: Not on file   Intimate Partner Violence: Not on file                                                                                                 Objective:  Physical Exam: BP (!) 154/99 (BP Location: Left Arm, Patient Position: Sitting, Cuff Size: Large)   Pulse 77   Temp 97.8 F (36.6 C) (Temporal)   Ht 5' 0.5" (1.537 m)   Wt 203 lb (92.1 kg)   LMP  (LMP Unknown)   SpO2 97%   BMI 38.99 kg/m    General: No acute distress. Awake and conversant.  Eyes: Normal conjunctiva, anicteric. Round symmetric pupils.  ENT: Hearing grossly intact. No nasal discharge.  Neck: Neck is supple. No masses or thyromegaly.  Respiratory: Respirations are non-labored. No auditory wheezing.  CTA B Skin: Warm. No rashes or ulcers.  Psych: Alert and oriented. Cooperative, Appropriate mood and affect, Normal judgment.  CV: No cyanosis or JVD, no lower extremity edema, regular rate and rhythm, no MRG MSK: Normal ambulation. No clubbing  Neuro: Sensation and CN II-XII grossly normal.        Alesia Banda, MD, MS

## 2021-12-05 NOTE — Assessment & Plan Note (Addendum)
Uncontrolled Check home numbers Follow-up 1 month, recent restratification labs negative renal dysfunction Counseled on lifestyle modifications

## 2022-01-10 ENCOUNTER — Encounter: Payer: Self-pay | Admitting: Family Medicine

## 2022-01-10 ENCOUNTER — Ambulatory Visit (INDEPENDENT_AMBULATORY_CARE_PROVIDER_SITE_OTHER): Payer: 59 | Admitting: Family Medicine

## 2022-01-10 VITALS — BP 126/78 | HR 74 | Temp 97.2°F | Wt 201.6 lb

## 2022-01-10 DIAGNOSIS — N939 Abnormal uterine and vaginal bleeding, unspecified: Secondary | ICD-10-CM

## 2022-01-10 DIAGNOSIS — D219 Benign neoplasm of connective and other soft tissue, unspecified: Secondary | ICD-10-CM

## 2022-01-10 DIAGNOSIS — K8020 Calculus of gallbladder without cholecystitis without obstruction: Secondary | ICD-10-CM | POA: Diagnosis not present

## 2022-01-10 DIAGNOSIS — Z0000 Encounter for general adult medical examination without abnormal findings: Secondary | ICD-10-CM

## 2022-01-10 DIAGNOSIS — E6609 Other obesity due to excess calories: Secondary | ICD-10-CM | POA: Diagnosis not present

## 2022-01-10 DIAGNOSIS — N926 Irregular menstruation, unspecified: Secondary | ICD-10-CM

## 2022-01-10 DIAGNOSIS — K5900 Constipation, unspecified: Secondary | ICD-10-CM

## 2022-01-10 DIAGNOSIS — Z6838 Body mass index (BMI) 38.0-38.9, adult: Secondary | ICD-10-CM | POA: Diagnosis not present

## 2022-01-10 DIAGNOSIS — I10 Essential (primary) hypertension: Secondary | ICD-10-CM

## 2022-01-10 DIAGNOSIS — R1084 Generalized abdominal pain: Secondary | ICD-10-CM | POA: Diagnosis not present

## 2022-01-10 DIAGNOSIS — Z113 Encounter for screening for infections with a predominantly sexual mode of transmission: Secondary | ICD-10-CM

## 2022-01-10 LAB — HEMOGLOBIN A1C: Hgb A1c MFr Bld: 6.3 % (ref 4.6–6.5)

## 2022-01-10 LAB — COMPREHENSIVE METABOLIC PANEL
ALT: 16 U/L (ref 0–35)
AST: 20 U/L (ref 0–37)
Albumin: 4.1 g/dL (ref 3.5–5.2)
Alkaline Phosphatase: 106 U/L (ref 39–117)
BUN: 7 mg/dL (ref 6–23)
CO2: 28 mEq/L (ref 19–32)
Calcium: 9.2 mg/dL (ref 8.4–10.5)
Chloride: 103 mEq/L (ref 96–112)
Creatinine, Ser: 0.9 mg/dL (ref 0.40–1.20)
GFR: 82.49 mL/min (ref >60.00)
Glucose, Bld: 94 mg/dL (ref 70–99)
Potassium: 3.4 mEq/L — ABNORMAL LOW (ref 3.5–5.1)
Sodium: 139 mEq/L (ref 135–145)
Total Bilirubin: 0.4 mg/dL (ref 0.2–1.2)
Total Protein: 7.7 g/dL (ref 6.0–8.3)

## 2022-01-10 LAB — LIPID PANEL
Cholesterol: 167 mg/dL (ref 0–200)
HDL: 40.4 mg/dL (ref >39.00)
LDL Cholesterol: 107 mg/dL — ABNORMAL HIGH (ref 0–99)
NonHDL: 126.88
Total CHOL/HDL Ratio: 4
Triglycerides: 97 mg/dL (ref 0.0–149.0)
VLDL: 19.4 mg/dL (ref 0.0–40.0)

## 2022-01-10 LAB — CBC WITH DIFFERENTIAL/PLATELET
Basophils Absolute: 0 10*3/uL (ref 0.0–0.1)
Basophils Relative: 0.9 % (ref 0.0–3.0)
Eosinophils Absolute: 0.1 10*3/uL (ref 0.0–0.7)
Eosinophils Relative: 1.8 % (ref 0.0–5.0)
HCT: 41.1 % (ref 36.0–46.0)
Hemoglobin: 13.4 g/dL (ref 12.0–15.0)
Lymphocytes Relative: 41.8 % (ref 12.0–46.0)
Lymphs Abs: 2 10*3/uL (ref 0.7–4.0)
MCHC: 32.6 g/dL (ref 30.0–36.0)
MCV: 76.5 fl — ABNORMAL LOW (ref 78.0–100.0)
Monocytes Absolute: 0.4 10*3/uL (ref 0.1–1.0)
Monocytes Relative: 8.7 % (ref 3.0–12.0)
Neutro Abs: 2.3 10*3/uL (ref 1.4–7.7)
Neutrophils Relative %: 46.8 % (ref 43.0–77.0)
Platelets: 389 10*3/uL (ref 150.0–400.0)
RBC: 5.37 Mil/uL — ABNORMAL HIGH (ref 3.87–5.11)
RDW: 13.9 % (ref 11.5–15.5)
WBC: 4.8 10*3/uL (ref 4.0–10.5)

## 2022-01-10 LAB — TSH: TSH: 2.59 u[IU]/mL (ref 0.35–5.50)

## 2022-01-10 LAB — URINALYSIS
Bilirubin Urine: NEGATIVE
Hgb urine dipstick: NEGATIVE
Ketones, ur: NEGATIVE
Leukocytes,Ua: NEGATIVE
Nitrite: NEGATIVE
Specific Gravity, Urine: 1.01 (ref 1.000–1.030)
Total Protein, Urine: NEGATIVE
Urine Glucose: NEGATIVE
Urobilinogen, UA: 0.2 (ref 0.0–1.0)
pH: 7 (ref 5.0–8.0)

## 2022-01-10 LAB — LIPASE: Lipase: 12 U/L (ref 11.0–59.0)

## 2022-01-10 LAB — POCT URINE PREGNANCY: Preg Test, Ur: NEGATIVE

## 2022-01-10 MED ORDER — OMEPRAZOLE 20 MG PO CPDR: 20.0000 mg | DELAYED_RELEASE_CAPSULE | Freq: Every day | ORAL | 3 refills | Status: DC

## 2022-01-10 NOTE — Progress Notes (Signed)
Chief Complaint:  Diamond Thompson is a 36 y.o. female who presents today for her annual comprehensive physical exam.    Assessment/Plan:   Problem List Items Addressed This Visit       Cardiovascular and Mediastinum   Primary hypertension    Improved on lifestyle modifications       Relevant Orders   Comprehensive metabolic panel (Completed)   Hemoglobin A1c (Completed)   Lipid panel (Completed)   TSH (Completed)   Urinalysis (Completed)     Genitourinary   Abnormal uterine bleeding (AUB)    History of irregular periods, now with intermittent vaginal spotting History of fibroids, negative endometrial biopsy in 2021 Previous work-up include normal TSH and prolactin levels in 2020  Has followed with OB/GYN in the past, but not past years Previously trialed with Depo-Provera, but not currently on therapy Differential includes PCOS, endocrine dysfunction, STI, fibroid      Relevant Orders   POCT urine pregnancy (Completed)   C. trachomatis/N. gonorrhoeae RNA (Completed)   Vitamin D 1,25 dihydroxy   FSH/LH (Completed)   US Abdomen Complete   US Pelvic Complete With Transvaginal     Other   Fibroids   Relevant Orders   US Pelvic Complete With Transvaginal   Morbid obesity ([place])    Counseled diet exercise       Relevant Orders   CBC with Differential (Completed)   Comprehensive metabolic panel (Completed)   Hemoglobin A1c (Completed)   Lipid panel (Completed)   TSH (Completed)   Urinalysis (Completed)   Vitamin D 1,25 dihydroxy   Generalized abdominal pain    Broad differential including GERD, gallbladder disease, other intrahepatic dysfunction, pancreatitis, UTI, constipation, sexual transmitted infection       Relevant Medications   omeprazole (PRILOSEC) 20 MG capsule   Other Relevant Orders   CBC with Differential (Completed)   Comprehensive metabolic panel (Completed)   Hemoglobin A1c (Completed)   Urinalysis (Completed)   POCT urine  pregnancy (Completed)   C. trachomatis/N. gonorrhoeae RNA (Completed)   US Abdomen Complete   US Pelvic Complete With Transvaginal   Lipase (Completed)   Other Visit Diagnoses     Routine general medical examination at a health care facility    -  Primary   Screen for sexually transmitted diseases       Relevant Orders   HIV antibody (with reflex) (Completed)   RPR (Completed)   Constipation, unspecified constipation type            Patient Counseling(The following topics were reviewed and/or handout was given):  -Nutrition: Stressed importance of moderation in sodium/caffeine intake, saturated fat and cholesterol, caloric balance, sufficient intake of fresh fruits, vegetables, and fiber.  -Stressed the importance of regular exercise.   -Substance Abuse: Discussed cessation/primary prevention of tobacco, alcohol, or other drug use; driving or other dangerous activities under the influence; availability of treatment for abuse.   -Injury prevention: Discussed safety belts, safety helmets, smoke detector, smoking near bedding or upholstery.   -Sexuality: Discussed sexually transmitted diseases, partner selection, use of condoms, avoidance of unintended pregnancy and contraceptive alternatives.   -Dental health: Discussed importance of regular tooth brushing, flossing, and dental visits.  -Health maintenance and immunizations reviewed. Please refer to Health maintenance section.  Return to care in 1 year for next preventative visit.     Subjective:  HPI:  Irregular Menstruation: Patient complains of irregular menses. No LMP recorded (lmp unknown). (Menstrual status: Irregular Periods). Periods are irregular, lasting  unknown  days. Dysmenorrhea:mild, . Cyclic symptoms include none.  Current contraception: none. History of infertility: not assessed. History of abnormal Pap smear: no.  Pelvic ultrasound in 2019 and 2020 showed presence of leiomyoma.  Endometrial biopsy was negative for  malignancy.  In 2020, normal TSH and prolactin levels.  Patient endorses recent mild intermittent vaginal spotting with blood.  She denies any vaginal discharge, vaginal sores, foul odor.   Abdominal Pain  She reports chronic abdominal pain. The most recent episode started  6 months ago and is staying constant. The abdominal pain is located in the  generalized  and does not radiate. It is described as bloating, is moderate in intensity, occurring  2 times a month. It is aggravated by fatty foods, spicy foods, and eating late at night and lying down  and is relieved by  nothing . She has tried  simethicone with no relief.  Patient endorses endorses daily bowel movements with soft stools.  She has not had any nausea, vomiting, hematochezia, melena. She denies chest pain or shortness of breath  Of note patient went to the emergency department 07/2021 for abdominal pain.  Work-up was consistent with constipation as listed below.  Associated symptoms: No anorexia  No belching  No bloody stool No blood in urine   No constipation No diarrhea  No dysuria No fever  No flatus No headaches  No headaches No joint pains  No myalgias No nausea  No vomiting No weight loss     Recent GI studies:ultrasound of pelvis, X-rays of abdomen, and with significant findings of significant stool burden on 07/2021 , US pelvis with multiple fibroids, endometrial pathology was negative for malignancy   Previous labs Lab Results  Component Value Date   WBC 4.8 01/10/2022   HGB 13.4 01/10/2022   HCT 41.1 01/10/2022   MCV 76.5 (L) 01/10/2022   MCH 24.3 (L) 07/20/2021   RDW 13.9 01/10/2022   PLT 389.0 01/10/2022   Lab Results  Component Value Date   GLUCOSE 94 01/10/2022   NA 139 01/10/2022   K 3.4 (L) 01/10/2022   CL 103 01/10/2022   CO2 28 01/10/2022   BUN 7 01/10/2022   CREATININE 0.90 01/10/2022   GFRNONAA >60 07/20/2021   CALCIUM 9.2 01/10/2022   PROT 7.7 01/10/2022   ALBUMIN 4.1 01/10/2022    BILITOT 0.4 01/10/2022   ALKPHOS 106 01/10/2022   AST 20 01/10/2022   ALT 16 01/10/2022   ANIONGAP 9 07/20/2021   No results found for: "AMYLASE" -----------------------------------------------------------------------------------------  Hypertension, established problem, Stable BP Readings from Last 3 Encounters:  01/10/22 126/78  12/05/21 (!) 154/99  07/20/21 (!) 161/114   Home BP monitoring: None Current Medications: Has worked on some lifestyle modifications, weight down 2 pounds since last visit, compliant without side effects.   ROS: Denies any chest pain, shortness of breath, dyspnea on exertion, leg edema.    Wt Readings from Last 3 Encounters:  01/10/22 201 lb 9.6 oz (91.4 kg)  12/05/21 203 lb (92.1 kg)  07/20/21 200 lb (90.7 kg)          01/10/2022    9:04 AM  Depression screen PHQ 2/9  Decreased Interest 1  Down, Depressed, Hopeless 0  PHQ - 2 Score 1  Altered sleeping 0  Tired, decreased energy 1  Change in appetite 1  Feeling bad or failure about yourself  0  Trouble concentrating 0  Moving slowly or fidgety/restless 0  Suicidal thoughts 0  PHQ-9 Score 3  Difficult doing work/chores Not difficult at all    Health Maintenance Due  Topic Date Due   COVID-19 Vaccine (3 - Pfizer series) 07/30/2020   PAP SMEAR-Modifier  10/20/2021      ROS: As per HPI, otherwise all systems reviewed and are negative  PMH:  The following were reviewed and entered/updated in epic: Past Medical History:  Diagnosis Date   Allergy    Asthma    Clotting disorder ([place]) 2019   I haven't had a cycle   Fibroid    Medical history non-contributory    Primary hypertension 12/05/2021   STD (sexually transmitted disease)    age 51?   Patient Active Problem List   Diagnosis Date Noted   Morbid obesity ([place]) 01/13/2022   Abnormal uterine bleeding (AUB) 01/13/2022   Generalized abdominal pain 01/13/2022   Primary hypertension 12/05/2021   Fibroids 04/27/2019   Past  Surgical History:  Procedure Laterality Date   NO PAST SURGERIES      Family History  Problem Relation Age of Onset   Hypertension Mother    Hypertension Maternal Grandmother    Hypertension Paternal Grandmother    Diabetes Paternal Grandmother    Hypertension Father     Medications- reviewed and updated Current Outpatient Medications  Medication Sig Dispense Refill   chlorhexidine (PERIDEX) 0.12 % solution SMARTSIG:By Mouth     omeprazole (PRILOSEC) 20 MG capsule Take 1 capsule (20 mg total) by mouth daily. 30 capsule 3   No current facility-administered medications for this visit.    Allergies-reviewed and updated Allergies  Allergen Reactions   Fruit Extracts Itching and Swelling    Raw fruit and vegetables.    Kiwi Extract     Raw fruits and veggies   Latex Itching    Social History   Socioeconomic History   Marital status: Married    Spouse name: Not on file   Number of children: Not on file   Years of education: high school   Highest education level: Not on file  Occupational History   Not on file  Tobacco Use   Smoking status: Never    Passive exposure: Never   Smokeless tobacco: Never   Tobacco comments:    tired   Vaping Use   Vaping Use: Never used  Substance and Sexual Activity   Alcohol use: Not Currently   Drug use: Not Currently   Sexual activity: Yes    Partners: Male    Birth control/protection: None  Other Topics Concern   Not on file  Social History Narrative   Not on file   Social Determinants of Health   Financial Resource Strain: Not on file  Food Insecurity: Not on file  Transportation Needs: Not on file  Physical Activity: Not on file  Stress: Not on file  Social Connections: Not on file        Objective:  Physical Exam: BP 126/78 (BP Location: Left Arm, Patient Position: Sitting, Cuff Size: Large)   Pulse 74   Temp (!) 97.2 F (36.2 C) (Temporal)   Wt 201 lb 9.6 oz (91.4 kg)   LMP  (LMP Unknown)   SpO2 98%   BMI  38.72 kg/m   Body mass index is 38.72 kg/m. Wt Readings from Last 3 Encounters:  01/10/22 201 lb 9.6 oz (91.4 kg)  12/05/21 203 lb (92.1 kg)  07/20/21 200 lb (90.7 kg)    Gen: NAD, resting comfortably CV: RRR with no murmurs appreciated Pulm: NWOB, CTAB with no crackles,  wheezes, or rhonchi GI: Normal bowel sounds present. Soft, Nontender, Nondistended. MSK: no edema, cyanosis, or clubbing noted Skin: warm, dry Neuro: grossly normal, moves all extremities Psych: Normal affect and thought content      At today's visit, we discussed treatment options, associated risk and benefits, and engage in counseling as needed.  Additionally the following were reviewed: Past medical records, past medical and surgical history, family and social background, as well as relevant laboratory results, imaging findings, and specialty notes, where applicable.  This message was generated using dictation software, and as a result, it may contain unintentional typos or errors.  Nevertheless, extensive effort was made to accurately convey at the pertinent aspects of the patient visit.    There may have been are other unrelated non-urgent complaints, but due to the busy schedule and the amount of time already spent with her, time does not permit to address these issues at today's visit. Another appointment may have or has been requested to review these additional issues.   Marny Lowenstein, MD, MS

## 2022-01-10 NOTE — Patient Instructions (Signed)
For abdominal pain we are getting lab work as discussed as well as ultrasound. It may be heart burn, try omeprazole as prescribed.   For irregular periods with abdominal pain and history of fibroids, we getting urine testing and pelvic ultrasound as dicussed. We referred you to OB/GYN. Please follow up as below.

## 2022-01-11 ENCOUNTER — Telehealth: Payer: Self-pay

## 2022-01-11 NOTE — Telephone Encounter (Signed)
Caller Name: Janett Billow -CT scan Call back phone #: [phone]  Reason for Call: Michela Pitcher she was just working with you on scheduling this patient. Asked for a call back to go over a few additional questions. Ask for Janett Billow. Thank you

## 2022-01-13 DIAGNOSIS — N939 Abnormal uterine and vaginal bleeding, unspecified: Secondary | ICD-10-CM | POA: Insufficient documentation

## 2022-01-13 DIAGNOSIS — R1084 Generalized abdominal pain: Secondary | ICD-10-CM | POA: Insufficient documentation

## 2022-01-13 HISTORY — DX: Abnormal uterine and vaginal bleeding, unspecified: N93.9

## 2022-01-13 NOTE — Assessment & Plan Note (Signed)
History of irregular periods, now with intermittent vaginal spotting History of fibroids, negative endometrial biopsy in 2021 Previous work-up include normal TSH and prolactin levels in 2020  Has followed with OB/GYN in the past, but not past years Previously trialed with Depo-Provera, but not currently on therapy Differential includes PCOS, endocrine dysfunction, STI, fibroid

## 2022-01-13 NOTE — Assessment & Plan Note (Signed)
Broad differential including GERD, gallbladder disease, other intrahepatic dysfunction, pancreatitis, UTI, constipation, sexual transmitted infection

## 2022-01-13 NOTE — Assessment & Plan Note (Signed)
Counseled diet exercise

## 2022-01-13 NOTE — Assessment & Plan Note (Signed)
Improved on lifestyle modifications

## 2022-01-14 LAB — C. TRACHOMATIS/N. GONORRHOEAE RNA
C. trachomatis RNA, TMA: NOT DETECTED
N. gonorrhoeae RNA, TMA: NOT DETECTED

## 2022-01-14 LAB — VITAMIN D 1,25 DIHYDROXY
Vitamin D 1, 25 (OH)2 Total: 65 pg/mL (ref 18–72)
Vitamin D2 1, 25 (OH)2: <8 pg/mL
Vitamin D3 1, 25 (OH)2: 65 pg/mL

## 2022-01-14 LAB — RPR: RPR Ser Ql: NONREACTIVE

## 2022-01-14 LAB — HIV ANTIBODY (ROUTINE TESTING W REFLEX): HIV 1&2 Ab, 4th Generation: NONREACTIVE

## 2022-01-14 LAB — FSH/LH
FSH: 4.9 m[IU]/mL
LH: 2.5 m[IU]/mL

## 2022-01-18 ENCOUNTER — Ambulatory Visit (HOSPITAL_COMMUNITY): Payer: 59

## 2022-01-18 ENCOUNTER — Other Ambulatory Visit (HOSPITAL_COMMUNITY): Payer: 59

## 2022-01-18 ENCOUNTER — Ambulatory Visit (HOSPITAL_COMMUNITY)
Admission: RE | Admit: 2022-01-18 | Discharge: 2022-01-18 | Disposition: A | Discharge status: Home / Self Care | Payer: 59 | Source: Ambulatory Visit | Attending: Family Medicine | Admitting: Family Medicine

## 2022-01-18 DIAGNOSIS — R1084 Generalized abdominal pain: Secondary | ICD-10-CM | POA: Diagnosis not present

## 2022-01-18 DIAGNOSIS — N926 Irregular menstruation, unspecified: Secondary | ICD-10-CM | POA: Diagnosis not present

## 2022-01-18 DIAGNOSIS — K8020 Calculus of gallbladder without cholecystitis without obstruction: Secondary | ICD-10-CM | POA: Diagnosis not present

## 2022-01-18 DIAGNOSIS — D219 Benign neoplasm of connective and other soft tissue, unspecified: Secondary | ICD-10-CM | POA: Diagnosis not present

## 2022-01-18 DIAGNOSIS — D251 Intramural leiomyoma of uterus: Secondary | ICD-10-CM | POA: Diagnosis not present

## 2022-01-18 DIAGNOSIS — N939 Abnormal uterine and vaginal bleeding, unspecified: Secondary | ICD-10-CM | POA: Insufficient documentation

## 2022-01-18 DIAGNOSIS — D252 Subserosal leiomyoma of uterus: Secondary | ICD-10-CM | POA: Diagnosis not present

## 2022-01-19 ENCOUNTER — Telehealth: Payer: Self-pay | Admitting: Family Medicine

## 2022-01-19 NOTE — Telephone Encounter (Signed)
Pt is wanting a cb concerning her most recent lab results. Please advise pt at [phone]

## 2022-01-19 NOTE — Addendum Note (Signed)
Addended by: Josephine Igo B on: 01/19/2022 09:04 AM   Modules accepted: Orders

## 2022-01-19 NOTE — Telephone Encounter (Signed)
Spoke with patient and went over [place] results and referral information. Provided patient with [facility] office number for scheduling.

## 2022-01-23 ENCOUNTER — Telehealth: Payer: Self-pay

## 2022-01-23 NOTE — Telephone Encounter (Signed)
-----   Message from Bonnita Hollow, MD sent at 01/23/2022 12:42 PM EDT ----- Received FMLA paperwork from [employer].  I have reviewed patient's chart and I am unsure of the exact nature of with the FMLA is needed for her.  Please have patient follow-up to discuss further before filling out.  Marny Lowenstein, MD, MS

## 2022-01-23 NOTE — Telephone Encounter (Signed)
Left patient a detailed voice message to return call to office regarding FMLA paperwork.

## 2022-01-25 ENCOUNTER — Telehealth: Payer: Self-pay

## 2022-01-25 NOTE — Telephone Encounter (Signed)
-----   Message from Bonnita Hollow, MD sent at 01/23/2022 12:42 PM EDT ----- Received FMLA paperwork from [employer].  I have reviewed patient's chart and I am unsure of the exact nature of with the FMLA is needed for her.  Please have patient follow-up to discuss further before filling out.  Marny Lowenstein, MD, MS

## 2022-01-25 NOTE — Telephone Encounter (Signed)
Mychart message sent to patient regarding annotation below.

## 2022-02-08 ENCOUNTER — Telehealth (INDEPENDENT_AMBULATORY_CARE_PROVIDER_SITE_OTHER): Payer: 59 | Admitting: Family Medicine

## 2022-02-08 DIAGNOSIS — F4321 Adjustment disorder with depressed mood: Secondary | ICD-10-CM

## 2022-02-08 NOTE — Progress Notes (Signed)
Virtual Visit via Video Note  I connected with Diamond Thompson on 02/08/22 at  2:20 PM EDT by a video enabled telemedicine application and verified that I am speaking with the correct person using two identifiers.  Location: Patient: [facility] Provider: Office   I discussed the limitations of evaluation and management by telemedicine and the availability of in person appointments. The patient expressed understanding and agreed to proceed.  History of Present Illness: She presents with chief complaint of FMLA paperwork (Patient states that her mother in law passed away and she was in and out of the hospital. She's currently under stress and would like at least a weeks of leave of absence. ) .   Grief.  Patient here to discuss difficult emotions after the loss of her mother-in-law.  Patient lost her mother-in-law on 02/03/2022.  Patient says that she was close to her mother-in-law and says that she was like a "second mother" to her.  Patient reports that they used to be a caregiver for her mother-in-law but that she had been in nursing home for quite a while.  Mother-in-law was recently hospitalized with acute illness and died shortly thereafter.  Patient reports that she has been able to go to work and is trying to stay "100% positive".  She is under a lot of stress and feels that she would like to have time off from work. Patient PHQ-9 is slightly elevated over the last time few weeks ago.  Patient is without SI or HI.  Patient does report that she has close relationships and social support.    02/08/2022    2:35 PM  Depression screen PHQ 2/9  Decreased Interest 2  Down, Depressed, Hopeless 0  PHQ - 2 Score 2  Altered sleeping 0  Tired, decreased energy 1  Change in appetite 2  Feeling bad or failure about yourself  0  Trouble concentrating 0  Moving slowly or fidgety/restless 1  Suicidal thoughts 0  PHQ-9 Score 6  Difficult doing work/chores Not difficult at all       01/10/2022     9:04 AM  GAD 7 : Generalized Anxiety Score  Nervous, Anxious, on Edge 0  Control/stop worrying 0  Worry too much - different things 1  Trouble relaxing 0  Restless 1  Easily annoyed or irritable 1  Afraid - awful might happen 0  Total GAD 7 Score 3  Anxiety Difficulty Not difficult at all      Observations/Objective: Gen: NAD, driving car  HEENT: EOMI Pulm: NWOB Skin: no rash on face Neuro: no facial asymmetry or dysmetria Psych: Normal affect   Assessment and Plan: Grief Patient experiencing grief of mother-in-law after recent death.  Patient is experiencing emotions of frustration and stress.  Did emphasize with patient over loss and expressed that the emotions she is experiencing are difficult, but normal and appropriate.  When tried to gauge patient, patient did have mild elevated PHQ-9 without SI or HI but through discussion does appear to be functioning moderately well despite frustration including being able to go to work regularly, participating in routine activities.  Patient with strong support. Discussed the approximate timeline of grief with the caveat that everyone every situation is different, but clinically right and observation for 6 weeks to see if mood does not adjust.  Discussed that I am happy to try to accommodate patient's need for time off whether need to be continuous such as the 1 week request or earlier or intermittent leave going  forward.  Also discussed treatment options including medication and therapy, however I recommended against starting any medication, patient did not express interest in any counseling intervention this time.  At the end discussion patient stated that she felt that she was able to continue without leave at this moment.  Did express that if patient changed her mind I am happy to fill out FMLA based on her needs.  Also expressed to patient that I would like to have some follow-up with patient whether be some message regarding her mood or  happy to see virtually or in person to follow-up.  Follow Up Instructions:    I discussed the assessment and treatment plan with the patient. The patient was provided an opportunity to ask questions and all were answered. The patient agreed with the plan and demonstrated an understanding of the instructions.   The patient was advised to call back or seek an in-person evaluation if the symptoms worsen or if the condition fails to improve as anticipated.  I provided 20 minutes of non-face-to-face time during this encounter.   Bonnita Hollow, MD

## 2022-03-03 ENCOUNTER — Other Ambulatory Visit (HOSPITAL_COMMUNITY): Payer: Self-pay

## 2022-03-03 MED ORDER — OMEPRAZOLE 20 MG PO CPDR
20.0000 mg | DELAYED_RELEASE_CAPSULE | Freq: Every day | ORAL | 2 refills | Status: DC
  Filled 2022-03-03: qty 30, 30d supply, fill #0
  Filled 2022-04-25: qty 30, 30d supply, fill #1
  Filled 2022-05-26: qty 30, 30d supply, fill #2

## 2022-03-03 MED ORDER — CHLORHEXIDINE GLUCONATE 0.12 % MT SOLN
OROMUCOSAL | 1 refills | Status: DC
  Filled 2022-03-03: qty 473, 30d supply, fill #0

## 2022-03-22 DIAGNOSIS — R1084 Generalized abdominal pain: Secondary | ICD-10-CM | POA: Diagnosis not present

## 2022-04-12 ENCOUNTER — Ambulatory Visit
Admission: EM | Admit: 2022-04-12 | Discharge: 2022-04-12 | Disposition: A | Discharge status: Home / Self Care | Payer: 59 | Attending: Physician Assistant | Admitting: Physician Assistant

## 2022-04-12 DIAGNOSIS — H00011 Hordeolum externum right upper eyelid: Secondary | ICD-10-CM

## 2022-04-12 DIAGNOSIS — R030 Elevated blood-pressure reading, without diagnosis of hypertension: Secondary | ICD-10-CM

## 2022-04-12 MED ORDER — AMLODIPINE BESYLATE 5 MG PO TABS: 5.0000 mg | ORAL_TABLET | Freq: Every day | ORAL | 1 refills | Status: DC

## 2022-04-12 MED ORDER — POLYMYXIN B-TRIMETHOPRIM 10000-0.1 UNIT/ML-% OP SOLN: 1.0000 [drp] | OPHTHALMIC | 0 refills | Status: DC

## 2022-04-12 NOTE — ED Provider Notes (Signed)
[facility]    CSN: [id number] Arrival date & time: 04/12/22  0853      History   Chief Complaint Chief Complaint  Patient presents with   right eye edema    HPI Diamond Thompson is a 37 y.o. female.   37 year old female presents with right upper eyelid swelling and discomfort.  Patient indicates for the past 2 days she has noticed that her right upper eyelid has been swelling intermittently and has become worse today.  She relates that the upper eyelid feels uncomfortable, tense or tight, and indicates there is irritation on the outside of the upper eyelid.  Patient indicates she has not traumatized or hurt the upper eyelid.  She relates she does not have any eye pain.  Patient indicates that there is no vision changes.  Patient denies any fever, chills, cough, congestion or upper respiratory type symptoms. Patient indicates that her blood pressure is elevated today and it has been elevated when she checks it at home for the past couple years.  She relates that she does have a family history of high blood pressure with her parents.  Patient is concerned because each time she checks her blood pressure it has been elevated.  Patient denies having symptoms such as headache, dizziness, or vision changes.  Patient indicates she does have a PCP in 6 months ago the blood pressure was checked and it was within the normal range.  Patient remains concerned as the blood pressure still remains elevated.  Patient is a non-smoker and tries to exercise on a regular basis.  She relates she is watching her salt intake.     Past Medical History:  Diagnosis Date   Allergy    Asthma    Clotting disorder ([place]) 2019   I haven't had a cycle   Fibroid    Medical history non-contributory    Primary hypertension 12/05/2021   STD (sexually transmitted disease)    age 3?    Patient Active Problem List   Diagnosis Date Noted   Morbid obesity ([place]) 01/13/2022   Abnormal uterine bleeding  (AUB) 01/13/2022   Generalized abdominal pain 01/13/2022   Primary hypertension 12/05/2021   Fibroids 04/27/2019    Past Surgical History:  Procedure Laterality Date   NO PAST SURGERIES      OB History     Gravida  0   Para  0   Term  0   Preterm  0   AB  0   Living  0      SAB  0   IAB  0   Ectopic  0   Multiple  0   Live Births  0            Home Medications    Prior to Admission medications   Medication Sig Start Date End Date Taking? Authorizing Provider  amLODipine (NORVASC) 5 MG tablet Take 1 tablet (5 mg total) by mouth daily. For high blood pressure. 04/12/22  Yes Nyoka Lint, PA-C  trimethoprim-polymyxin b (POLYTRIM) ophthalmic solution Place 1 drop into the right eye every 4 (four) hours. 04/12/22  Yes Nyoka Lint, PA-C  chlorhexidine (PERIDEX) 0.12 % solution SMARTSIG:By Mouth 01/05/22   [provider]  chlorhexidine (PERIDEX) 0.12 % solution Use as directed 01/09/22     omeprazole (PRILOSEC) 20 MG capsule Take 1 capsule (20 mg total) by mouth daily. 01/10/22   Bonnita Hollow, MD  omeprazole (PRILOSEC) 20 MG capsule Take 1 capsule (20  mg total) by mouth daily. 01/10/22   Bonnita Hollow, MD    Family History Family History  Problem Relation Age of Onset   Hypertension Mother    Hypertension Maternal Grandmother    Hypertension Paternal Grandmother    Diabetes Paternal Grandmother    Hypertension Father     Social History Social History   Tobacco Use   Smoking status: Never    Passive exposure: Never   Smokeless tobacco: Never   Tobacco comments:    tired   Vaping Use   Vaping Use: Never used  Substance Use Topics   Alcohol use: Not Currently   Drug use: Not Currently     Allergies   Fruit extracts, Kiwi extract, and Latex   Review of Systems Review of Systems  Eyes:  Positive for pain (right upper eyelid).     Physical Exam Triage Vital Signs ED Triage Vitals  Enc Vitals Group     BP 04/12/22 0951 (!)  160/127     Pulse Rate 04/12/22 0949 81     Resp 04/12/22 0949 16     Temp 04/12/22 0949 98.4 F (36.9 C)     Temp Source 04/12/22 0949 Oral     SpO2 04/12/22 0949 98 %     Weight --      Height --      Head Circumference --      Peak Flow --      Pain Score 04/12/22 0949 3     Pain Loc --      Pain Edu? --      Excl. in [place]? --    No data found.  Updated Vital Signs BP (!) 160/127 (BP Location: Left Arm)   Pulse 81   Temp 98.4 F (36.9 C) (Oral)   Resp 16   SpO2 98%   Visual Acuity Right Eye Distance:   Left Eye Distance:   Bilateral Distance:    Right Eye Near:   Left Eye Near:    Bilateral Near:     Physical Exam Constitutional:      Appearance: Normal appearance.  HENT:     Right Ear: Tympanic membrane and ear canal normal.     Left Ear: Tympanic membrane and ear canal normal.     Mouth/Throat:     Mouth: Mucous membranes are moist.     Pharynx: Oropharynx is clear. No posterior oropharyngeal erythema.  Eyes:      Comments: Right eye: Upper eyelid has 1+ swelling with a stye present at the lateral eyelash border on the internal side.  There is no drainage noted.  Conjunctiva appears normal.  Cardiovascular:     Rate and Rhythm: Normal rate and regular rhythm.     Heart sounds: Normal heart sounds.  Pulmonary:     Effort: Pulmonary effort is normal.     Breath sounds: Normal breath sounds and air entry. No wheezing, rhonchi or rales.  Lymphadenopathy:     Cervical: No cervical adenopathy.  Neurological:     Mental Status: She is alert.      UC Treatments / Results  Labs (all labs ordered are listed, but only abnormal results are displayed) Labs Reviewed - No data to display  EKG   Radiology No results found.  Procedures Procedures (including critical care time)  Medications Ordered in UC Medications - No data to display  Initial Impression / Assessment and Plan / UC Course  I have reviewed the triage vital signs and the  nursing  notes.  Pertinent labs & imaging results that were available during my care of the patient were reviewed by me and considered in my medical decision making (see chart for details).    Plan: 1.  The stye of the right upper eyelid will be treated with the following: A.  Polytrim eyedrops, 1 drop every 4-6 hours on a regular basis till the stye resolves. B.  Warm compresses frequently to help the stye resolved. 2.  Elevated blood pressure will be treated with the following: A.  Start Norvasc 5 mg, 1 tablet daily to address the hypertension. 3.  Patient advised to follow-up with PCP in 3-4 weeks to have blood pressure rechecked and medication adjusted if needed. 4.  Advised to return to urgent care as needed. Final Clinical Impressions(s) / UC Diagnoses   Final diagnoses:  Hordeolum externum of right upper eyelid  Elevated blood pressure reading     Discharge Instructions      Advised to use warm compresses frequently in order to help the stye resolved. Advised to use the Polytrim eyedrops, 1 drop in the eye every 4-6 hours on a regular basis till the stye resolves.  Advised to start Norvasc (amlodipine) 5 mg, 1 tablet daily usually in the morning to help control high blood pressure. Advised to continue to exercise on a regular basis and watch salt intake.  Advised to have the blood pressure rechecked by your PCP in 3 to 4 weeks.  Continue to take your blood pressure at home and advised to write down the date, time, and blood pressure reading so that she can present this to your PCP for review.  Advised follow-up and return to urgent care as needed.    ED Prescriptions     Medication Sig Dispense Auth. Provider   trimethoprim-polymyxin b (POLYTRIM) ophthalmic solution Place 1 drop into the right eye every 4 (four) hours. 10 mL Nyoka Lint, PA-C   amLODipine (NORVASC) 5 MG tablet Take 1 tablet (5 mg total) by mouth daily. For high blood pressure. 30 tablet Nyoka Lint, PA-C       PDMP not reviewed this encounter.   Nyoka Lint, PA-C 04/12/22 1015

## 2022-04-12 NOTE — Discharge Instructions (Signed)
Advised to use warm compresses frequently in order to help the stye resolved. Advised to use the Polytrim eyedrops, 1 drop in the eye every 4-6 hours on a regular basis till the stye resolves.  Advised to start Norvasc (amlodipine) 5 mg, 1 tablet daily usually in the morning to help control high blood pressure. Advised to continue to exercise on a regular basis and watch salt intake.  Advised to have the blood pressure rechecked by your PCP in 3 to 4 weeks.  Continue to take your blood pressure at home and advised to write down the date, time, and blood pressure reading so that she can present this to your PCP for review.  Advised follow-up and return to urgent care as needed.

## 2022-04-12 NOTE — ED Triage Notes (Signed)
Pt c/o right periorbital edema onset ~ yesterday. States hurts when blinking. Denies discharge, change in vision, headache, nausea.   Checked BP twice. Pt denies HTN dx or tx. Reports does have a PCP.

## 2022-05-23 ENCOUNTER — Ambulatory Visit: Payer: 59 | Admitting: Family Medicine

## 2022-05-23 ENCOUNTER — Encounter: Payer: Self-pay | Admitting: Family Medicine

## 2022-05-23 VITALS — BP 132/80 | HR 78 | Temp 97.8°F | Ht 60.5 in | Wt 203.4 lb

## 2022-05-23 DIAGNOSIS — R221 Localized swelling, mass and lump, neck: Secondary | ICD-10-CM | POA: Diagnosis not present

## 2022-05-23 DIAGNOSIS — I10 Essential (primary) hypertension: Secondary | ICD-10-CM | POA: Diagnosis not present

## 2022-05-23 DIAGNOSIS — K8080 Other cholelithiasis without obstruction: Secondary | ICD-10-CM

## 2022-05-23 LAB — COMPREHENSIVE METABOLIC PANEL
ALT: 20 U/L (ref 0–35)
AST: 18 U/L (ref 0–37)
Albumin: 4.1 g/dL (ref 3.5–5.2)
Alkaline Phosphatase: 91 U/L (ref 39–117)
BUN: 12 mg/dL (ref 6–23)
CO2: 28 mEq/L (ref 19–32)
Calcium: 9.3 mg/dL (ref 8.4–10.5)
Chloride: 101 mEq/L (ref 96–112)
Creatinine, Ser: 0.78 mg/dL (ref 0.40–1.20)
GFR: 97.7 mL/min (ref >60.00)
Glucose, Bld: 78 mg/dL (ref 70–99)
Potassium: 3.4 mEq/L — ABNORMAL LOW (ref 3.5–5.1)
Sodium: 137 mEq/L (ref 135–145)
Total Bilirubin: 0.3 mg/dL (ref 0.2–1.2)
Total Protein: 7.2 g/dL (ref 6.0–8.3)

## 2022-05-23 LAB — CBC WITH DIFFERENTIAL/PLATELET
Basophils Absolute: 0 10*3/uL (ref 0.0–0.1)
Basophils Relative: 0.6 % (ref 0.0–3.0)
Eosinophils Absolute: 0.1 10*3/uL (ref 0.0–0.7)
Eosinophils Relative: 1.2 % (ref 0.0–5.0)
HCT: 39.9 % (ref 36.0–46.0)
Hemoglobin: 13.2 g/dL (ref 12.0–15.0)
Lymphocytes Relative: 42.8 % (ref 12.0–46.0)
Lymphs Abs: 2.4 10*3/uL (ref 0.7–4.0)
MCHC: 33 g/dL (ref 30.0–36.0)
MCV: 76.6 fl — ABNORMAL LOW (ref 78.0–100.0)
Monocytes Absolute: 0.4 10*3/uL (ref 0.1–1.0)
Monocytes Relative: 7.5 % (ref 3.0–12.0)
Neutro Abs: 2.6 10*3/uL (ref 1.4–7.7)
Neutrophils Relative %: 47.9 % (ref 43.0–77.0)
Platelets: 400 10*3/uL (ref 150.0–400.0)
RBC: 5.21 Mil/uL — ABNORMAL HIGH (ref 3.87–5.11)
RDW: 13.8 % (ref 11.5–15.5)
WBC: 5.5 10*3/uL (ref 4.0–10.5)

## 2022-05-23 LAB — MICROALBUMIN / CREATININE URINE RATIO
Creatinine,U: 116.8 mg/dL
Microalb Creat Ratio: 10.9 mg/g (ref 0.0–30.0)
Microalb, Ur: 12.7 mg/dL — ABNORMAL HIGH (ref 0.0–1.9)

## 2022-05-23 LAB — LIPID PANEL
Cholesterol: 162 mg/dL (ref 0–200)
HDL: 41.7 mg/dL (ref >39.00)
LDL Cholesterol: 101 mg/dL — ABNORMAL HIGH (ref 0–99)
NonHDL: 120.58
Total CHOL/HDL Ratio: 4
Triglycerides: 99 mg/dL (ref 0.0–149.0)
VLDL: 19.8 mg/dL (ref 0.0–40.0)

## 2022-05-23 LAB — TSH: TSH: 2.25 u[IU]/mL (ref 0.35–5.50)

## 2022-05-23 LAB — LIPASE: Lipase: 12 U/L (ref 11.0–59.0)

## 2022-05-23 LAB — HEMOGLOBIN A1C: Hgb A1c MFr Bld: 6.2 % (ref 4.6–6.5)

## 2022-05-23 MED ORDER — AMLODIPINE-OLMESARTAN 10-40 MG PO TABS
1.0000 | ORAL_TABLET | Freq: Every day | ORAL | 3 refills | Status: DC
  Filled 2022-07-22: qty 30, 30d supply, fill #0

## 2022-05-23 MED ORDER — DOXYCYCLINE HYCLATE 100 MG PO TABS: 100.0000 mg | ORAL_TABLET | Freq: Two times a day (BID) | ORAL | 0 refills | Status: AC

## 2022-05-23 NOTE — Progress Notes (Signed)
Assessment/Plan:   Problem List Items Addressed This Visit       Cardiovascular and Mediastinum   Primary hypertension    Elevated blood pressure readings at home warrant adjustment of medication. Initiating Amlodipine-olmesartan combination therapy with close monitoring of blood pressure and kidney function is indicated. Laboratory work-up to include renal function tests prior to medication change.      Relevant Medications   amLODipine-olmesartan (AZOR) 10-40 MG tablet   Other Relevant Orders   TSH (Completed)   Lipid panel (Completed)   Hemoglobin A1c (Completed)   Microalbumin / creatinine urine ratio (Completed)   CBC with Differential/Platelet (Completed)   Comprehensive metabolic panel (Completed)   Urinalysis, Routine w reflex microscopic     Digestive   Cholelithiasis    Plan to discuss potential cholecystectomy with general surgery given ultrasound evidence of gallstones and ongoing severe symptoms despite medical management.      Relevant Orders   CBC with Differential/Platelet (Completed)   Comprehensive metabolic panel (Completed)   Lipase (Completed)     Other   Neck mass - Primary    The palpable lesion on the scalp warrants further evaluation. Initiate Doxycycline to treat potential infection and order an ultrasound to determine if it is a recurring cyst or has other etiologies.      Relevant Medications   doxycycline (VIBRA-TABS) 100 MG tablet   Other Relevant Orders   US Soft Tissue Head/Neck (NON-THYROID) (Completed)    Medications Discontinued During This Encounter  Medication Reason   amLODipine (NORVASC) 5 MG tablet       Subjective:  HPI: Encounter date: 05/23/2022  Taylir Gilkes is a 37 y.o. female who has Fibroids; Primary hypertension; Morbid obesity ([place]); Abnormal uterine bleeding (AUB); Generalized abdominal pain; Cholelithiasis; and Neck mass on their problem list..   She  has a past medical history of Allergy, Asthma,  Clotting disorder ([place]) (2019), Fibroid, Medical history non-contributory, Primary hypertension (12/05/2021), and STD (sexually transmitted disease)..   Chief Complaint: Patient reports a concerning spot on the back of the scalp with discomfort.  History of Present Illness:  Hypertension. The patient has a documented history of primary hypertension and is currently on Amlodipine 5 mg daily, with noted good blood pressure control at today's visit. However, the patient reports that home blood pressure readings have been elevated, with the most recent recording at approximately 0000000 mmHg systolic. Given these findings, there is a plan to modify the antihypertensive regimen to potentially include Olmesartan in combination with Amlodipine.  Gallbladder Issues. The patient has been previously diagnosed with gallstones and has been experiencing intermittent severe abdominal discomfort. Despite being on Omeprazole as previously prescribed, the patient continues to have episodes of pain, which suggests a lack of efficacy of the medical management. Referral to general surgery for further evaluation and potential cholecystectomy is being considered.  Scalp Concern. The patient mentions a spot on the back of the scalp that has become painful, although it has not increased in size. The spot has been present for an extended period, with a history of becoming inflamed and discharging in the past due to tight hair braiding. An antibiotic course with Doxycycline and an ultrasound study of the area have been planned.  The patient's history of abdominal and ocular infections, as indicated by the need for Chlorhexidine and Trimethoprim-Polymyxin B eye drops in the past, should be noted for consideration in the patient's overall health context.  ROS:  Cardiology: Negative for chest pain, palpitations, or edema.  BP elevated at home. Gastrointestinal: Positive for abdominal pain, negative for nausea, vomiting, or changes  in bowel movements. Dermatology: Positive for a concerning spot on the scalp, no other rashes or skin changes noted. Respiratory, Endocrine, Genitourinary, Neurological, Musculoskeletal systems: No significant findings reported. Remainder of ROS negative.   Past Surgical History:  Procedure Laterality Date   NO PAST SURGERIES      Outpatient Medications Prior to Visit  Medication Sig Dispense Refill   chlorhexidine (PERIDEX) 0.12 % solution SMARTSIG:By Mouth     omeprazole (PRILOSEC) 20 MG capsule Take 1 capsule (20 mg total) by mouth daily. 30 capsule 3   amLODipine (NORVASC) 5 MG tablet Take 1 tablet (5 mg total) by mouth daily. For high blood pressure. 30 tablet 1   chlorhexidine (PERIDEX) 0.12 % solution Use as directed (Patient not taking: Reported on 05/23/2022) 473 mL 1   omeprazole (PRILOSEC) 20 MG capsule Take 1 capsule (20 mg total) by mouth daily. (Patient not taking: Reported on 05/23/2022) 30 capsule 2   trimethoprim-polymyxin b (POLYTRIM) ophthalmic solution Place 1 drop into the right eye every 4 (four) hours. (Patient not taking: Reported on 05/23/2022) 10 mL 0   No facility-administered medications prior to visit.    Family History  Problem Relation Age of Onset   Hypertension Mother    Hypertension Maternal Grandmother    Hypertension Paternal Grandmother    Diabetes Paternal Grandmother    Hypertension Father     Social History   Socioeconomic History   Marital status: Married    Spouse name: Not on file   Number of children: Not on file   Years of education: high school   Highest education level: Not on file  Occupational History   Not on file  Tobacco Use   Smoking status: Never    Passive exposure: Never   Smokeless tobacco: Never   Tobacco comments:    tired   Vaping Use   Vaping Use: Never used  Substance and Sexual Activity   Alcohol use: Not Currently   Drug use: Not Currently   Sexual activity: Yes    Partners: Male    Birth  control/protection: None  Other Topics Concern   Not on file  Social History Narrative   Not on file   Social Determinants of Health   Financial Resource Strain: Not on file  Food Insecurity: Not on file  Transportation Needs: Not on file  Physical Activity: Not on file  Stress: Not on file  Social Connections: Not on file  Intimate Partner Violence: Not on file                                                                                                 Objective:  Physical Exam: BP 132/80 (BP Location: Left Arm, Patient Position: Sitting, Cuff Size: Large)   Pulse 78   Temp 97.8 F (36.6 C) (Temporal)   Ht 5' 0.5" (1.537 m)   Wt 203 lb 6.4 oz (92.3 kg)   SpO2 98%   BMI 39.07 kg/m    General: No acute distress. Awake and conversant.  Eyes: Normal conjunctiva, anicteric. Round symmetric pupils.  ENT: Hearing grossly intact. No nasal discharge.  Neck: Cystic structure on posterior left neck  Respiratory: Respirations are non-labored. No auditory wheezing.  Skin: Warm. No rashes or ulcers. Psych: Alert and oriented. Cooperative, Appropriate mood and affect, Normal judgment.  CV: No cyanosis or JVD ABD: nontender, nondistended MSK: Normal ambulation. No clubbing  Neuro: Sensation and CN II-XII grossly normal.       Alesia Banda, MD, MS

## 2022-05-23 NOTE — Patient Instructions (Signed)
Blood Pressure Management:  Start amlodipine 5 mg. A new combination pill will be added, containing amlodipine 5 mg and olmesartan 20 mg. Take this medication as directed. Monitor your blood pressure at home, particularly first thing in the morning, and record the readings. Bring these records to your next appointment. Return for a follow-up visit in 2 weeks to recheck blood pressure and review medication effectiveness.  Blood Work:  Blood work to be done today to assess kidney function and other basic parameters given the change in blood pressure. A second blood work appointment is scheduled for 2 weeks from now to re-evaluate kidney function after starting the olmesartan combination.  Neck/Head Spot Examination:  Doxycycline will be prescribed to address the possibility of an infection at the painful spot on the back of your head. Take the medication as prescribed. An ultrasound will be ordered to further evaluate the spot on the back of your head to confirm if it is indeed a cystic structure or something else that requires attention.  Gallbladder Issue:  Reach out to your general surgery team to discuss the possibility of gallbladder removal, especially if the pain persists or worsens despite taking your prescribed medication. Continue taking the prescribed medication for abdominal discomfort in the meantime and monitor your symptoms.  Please keep all your follow-up appointments, and do not hesitate to reach out if you have any questions or concerns about your treatment plan or if your condition changes. It's also essential to stay hydrated, maintain a healthy diet, and get adequate rest to support your overall health and recovery.  Our office will call you regarding the scheduling of your ultrasound and any other necessary follow-up. Take care, and we'll see you in 2 weeks.

## 2022-05-26 ENCOUNTER — Ambulatory Visit (HOSPITAL_BASED_OUTPATIENT_CLINIC_OR_DEPARTMENT_OTHER)
Admission: RE | Admit: 2022-05-26 | Discharge: 2022-05-26 | Disposition: A | Discharge status: Home / Self Care | Payer: 59 | Source: Ambulatory Visit | Attending: Family Medicine | Admitting: Family Medicine

## 2022-05-26 DIAGNOSIS — R221 Localized swelling, mass and lump, neck: Secondary | ICD-10-CM | POA: Insufficient documentation

## 2022-05-27 ENCOUNTER — Other Ambulatory Visit (HOSPITAL_COMMUNITY): Payer: Self-pay

## 2022-05-28 DIAGNOSIS — R221 Localized swelling, mass and lump, neck: Secondary | ICD-10-CM

## 2022-05-28 DIAGNOSIS — K8020 Calculus of gallbladder without cholecystitis without obstruction: Secondary | ICD-10-CM | POA: Insufficient documentation

## 2022-05-28 HISTORY — DX: Calculus of gallbladder without cholecystitis without obstruction: K80.20

## 2022-05-28 HISTORY — DX: Localized swelling, mass and lump, neck: R22.1

## 2022-05-28 NOTE — Assessment & Plan Note (Signed)
Elevated blood pressure readings at home warrant adjustment of medication. Initiating Amlodipine-olmesartan combination therapy with close monitoring of blood pressure and kidney function is indicated. Laboratory work-up to include renal function tests prior to medication change.

## 2022-05-28 NOTE — Assessment & Plan Note (Signed)
Plan to discuss potential cholecystectomy with general surgery given ultrasound evidence of gallstones and ongoing severe symptoms despite medical management.

## 2022-05-28 NOTE — Assessment & Plan Note (Signed)
The palpable lesion on the scalp warrants further evaluation. Initiate Doxycycline to treat potential infection and order an ultrasound to determine if it is a recurring cyst or has other etiologies.

## 2022-06-05 ENCOUNTER — Ambulatory Visit (INDEPENDENT_AMBULATORY_CARE_PROVIDER_SITE_OTHER): Payer: 59 | Admitting: Family Medicine

## 2022-06-05 ENCOUNTER — Encounter: Payer: Self-pay | Admitting: Family Medicine

## 2022-06-05 VITALS — BP 134/86 | HR 72 | Temp 97.6°F | Ht 65.0 in | Wt 201.2 lb

## 2022-06-05 DIAGNOSIS — I10 Essential (primary) hypertension: Secondary | ICD-10-CM | POA: Diagnosis not present

## 2022-06-05 DIAGNOSIS — R200 Anesthesia of skin: Secondary | ICD-10-CM

## 2022-06-05 DIAGNOSIS — R1084 Generalized abdominal pain: Secondary | ICD-10-CM

## 2022-06-05 DIAGNOSIS — R221 Localized swelling, mass and lump, neck: Secondary | ICD-10-CM

## 2022-06-05 DIAGNOSIS — Z124 Encounter for screening for malignant neoplasm of cervix: Secondary | ICD-10-CM | POA: Diagnosis not present

## 2022-06-05 DIAGNOSIS — Z01419 Encounter for gynecological examination (general) (routine) without abnormal findings: Secondary | ICD-10-CM | POA: Diagnosis not present

## 2022-06-05 DIAGNOSIS — Z6839 Body mass index (BMI) 39.0-39.9, adult: Secondary | ICD-10-CM | POA: Diagnosis not present

## 2022-06-05 DIAGNOSIS — N915 Oligomenorrhea, unspecified: Secondary | ICD-10-CM | POA: Diagnosis not present

## 2022-06-05 DIAGNOSIS — N941 Unspecified dyspareunia: Secondary | ICD-10-CM | POA: Diagnosis not present

## 2022-06-05 DIAGNOSIS — L680 Hirsutism: Secondary | ICD-10-CM | POA: Diagnosis not present

## 2022-06-05 DIAGNOSIS — D259 Leiomyoma of uterus, unspecified: Secondary | ICD-10-CM | POA: Diagnosis not present

## 2022-06-05 LAB — COMPREHENSIVE METABOLIC PANEL
ALT: 16 U/L (ref 0–35)
AST: 17 U/L (ref 0–37)
Albumin: 4 g/dL (ref 3.5–5.2)
Alkaline Phosphatase: 109 U/L (ref 39–117)
BUN: 9 mg/dL (ref 6–23)
CO2: 30 mEq/L (ref 19–32)
Calcium: 9.4 mg/dL (ref 8.4–10.5)
Chloride: 102 mEq/L (ref 96–112)
Creatinine, Ser: 0.81 mg/dL (ref 0.40–1.20)
GFR: 93.35 mL/min (ref >60.00)
Glucose, Bld: 89 mg/dL (ref 70–99)
Potassium: 4.1 mEq/L (ref 3.5–5.1)
Sodium: 140 mEq/L (ref 135–145)
Total Bilirubin: 0.4 mg/dL (ref 0.2–1.2)
Total Protein: 7.4 g/dL (ref 6.0–8.3)

## 2022-06-05 MED ORDER — OMEPRAZOLE 20 MG PO CPDR
20.0000 mg | DELAYED_RELEASE_CAPSULE | Freq: Every day | ORAL | 3 refills | Status: AC
  Filled 2022-07-22: qty 30, 30d supply, fill #0
  Filled 2022-10-06: qty 30, 30d supply, fill #1
  Filled 2023-04-20: qty 30, 30d supply, fill #2
  Filled 2023-05-27: qty 30, 30d supply, fill #3

## 2022-06-05 NOTE — Assessment & Plan Note (Signed)
The neck ultrasound showed a partially cystic lymph node not clearly benign or malignant.   Plan:  Await the scheduled evaluation by ENT for potential fluid sampling. Follow-up on the referral process and ensure the patient receives the necessary appointments.

## 2022-06-05 NOTE — Assessment & Plan Note (Signed)
The patient's blood pressure is better controlled with the current regimen. No changes are indicated at this time.   Plan:  Continue Amlodipine 10 mg / Olmesartan 40 mg. Schedule a follow-up in three months to monitor blood pressure management. Encourage home monitoring of blood pressure, if possible.

## 2022-06-05 NOTE — Assessment & Plan Note (Signed)
Intermittent tingling in the right arm, improving with stretching, potentially related to a cervical spine issue.   Plan:  Monitor symptoms for progression or the development of pain, weakness, or worsening numbness. Consider neck stretches and physical therapy as conservative management. Evaluate further with imaging like X-ray or MRI if symptoms persist or escalate.

## 2022-06-05 NOTE — Patient Instructions (Signed)
Blood pressure is well-controlled.  Please continue medicine.  Please check blood pressure at home at least 3 times a week.

## 2022-06-05 NOTE — Progress Notes (Signed)
Assessment/Plan:   Problem List Items Addressed This Visit       Cardiovascular and Mediastinum   Primary hypertension - Primary    The patient's blood pressure is better controlled with the current regimen. No changes are indicated at this time.   Plan:  Continue Amlodipine 10 mg / Olmesartan 40 mg. Schedule a follow-up in three months to monitor blood pressure management. Encourage home monitoring of blood pressure, if possible.      Relevant Orders   Comp Met (CMET)     Other   Generalized abdominal pain   Relevant Medications   omeprazole (PRILOSEC) 20 MG capsule   Neck mass    The neck ultrasound showed a partially cystic lymph node not clearly benign or malignant.   Plan:  Await the scheduled evaluation by ENT for potential fluid sampling. Follow-up on the referral process and ensure the patient receives the necessary appointments.      Right arm numbness    Intermittent tingling in the right arm, improving with stretching, potentially related to a cervical spine issue.   Plan:  Monitor symptoms for progression or the development of pain, weakness, or worsening numbness. Consider neck stretches and physical therapy as conservative management. Evaluate further with imaging like X-ray or MRI if symptoms persist or escalate.       Medications Discontinued During This Encounter  Medication Reason   chlorhexidine (PERIDEX) 0.12 % solution    omeprazole (PRILOSEC) 20 MG capsule    trimethoprim-polymyxin b (POLYTRIM) ophthalmic solution    omeprazole (PRILOSEC) 20 MG capsule Reorder      Subjective:  HPI: Encounter date: 06/05/2022  Diamond Thompson is a 37 y.o. female who has Fibroids; Primary hypertension; Morbid obesity ([place]); Abnormal uterine bleeding (AUB); Generalized abdominal pain; Cholelithiasis; Neck mass; and Right arm numbness on their problem list..   She  has a past medical history of Allergy, Asthma, Clotting disorder ([place]) (2019),  Fibroid, Medical history non-contributory, Primary hypertension (12/05/2021), and STD (sexually transmitted disease)..   CHIEF COMPLAINT: Patient presents for follow-up on blood pressure management and assessment of neck nodule.  HISTORY OF PRESENT ILLNESS:  Blood Pressure. Patient's blood pressure is better controlled at 134/86 following medication adjustment. Previously on Amlodipine 5 mg, increased to 10 mg, and addition of Olmesartan 40 mg. Patient reports tolerating the new medication regimen well without side effects. No symptoms of chest pain, shortness of breath, blurry vision, or headaches were reported.  Neck Nodule. The patient reports a neck nodule that was evaluated via ultrasound. Findings suggested a partially cystic lymph node without clear markers of malignancy. Radiology recommended further assessment through fluid sampling by interventional radiology, for which a referral has been made. The patient has been contacted for a referral possibly related to fibroids, but has not yet received communication regarding the evaluation of the lymph node.  Right Arm Tingling. The patient reports occasional right arm tingling, lately more noticeable since taking new medications. The symptom improves with stretching and has not been accompanied by pain or worse symptoms. There is consideration of a possible neck issue causing nerve impingement with subsequent tingling in the arm.  GERD.  Patient needs refill on omeprazole 20 mg.  Symptoms are stable.   Past Surgical History:  Procedure Laterality Date   NO PAST SURGERIES      Outpatient Medications Prior to Visit  Medication Sig Dispense Refill   amLODipine-olmesartan (AZOR) 10-40 MG tablet Take 1 tablet by mouth daily. 90 tablet 3  chlorhexidine (PERIDEX) 0.12 % solution SMARTSIG:By Mouth     omeprazole (PRILOSEC) 20 MG capsule Take 1 capsule (20 mg total) by mouth daily. 30 capsule 3   chlorhexidine (PERIDEX) 0.12 % solution Use as  directed (Patient not taking: Reported on 05/23/2022) 473 mL 1   omeprazole (PRILOSEC) 20 MG capsule Take 1 capsule (20 mg total) by mouth daily. (Patient not taking: Reported on 05/23/2022) 30 capsule 2   trimethoprim-polymyxin b (POLYTRIM) ophthalmic solution Place 1 drop into the right eye every 4 (four) hours. (Patient not taking: Reported on 05/23/2022) 10 mL 0   No facility-administered medications prior to visit.    Family History  Problem Relation Age of Onset   Hypertension Mother    Hypertension Maternal Grandmother    Hypertension Paternal Grandmother    Diabetes Paternal Grandmother    Hypertension Father     Social History   Socioeconomic History   Marital status: Married    Spouse name: Not on file   Number of children: Not on file   Years of education: high school   Highest education level: Not on file  Occupational History   Not on file  Tobacco Use   Smoking status: Never    Passive exposure: Never   Smokeless tobacco: Never   Tobacco comments:    tired   Vaping Use   Vaping Use: Never used  Substance and Sexual Activity   Alcohol use: Not Currently   Drug use: Not Currently   Sexual activity: Yes    Partners: Male    Birth control/protection: None  Other Topics Concern   Not on file  Social History Narrative   Not on file   Social Determinants of Health   Financial Resource Strain: Not on file  Food Insecurity: Not on file  Transportation Needs: Not on file  Physical Activity: Not on file  Stress: Not on file  Social Connections: Not on file  Intimate Partner Violence: Not on file                                                                                                 Objective:  Physical Exam: BP 134/86 (BP Location: Left Arm, Patient Position: Sitting, Cuff Size: Large)   Pulse 72   Temp 97.6 F (36.4 C) (Temporal)   Ht '5\' 5"'$  (1.651 m)   Wt 201 lb 3.2 oz (91.3 kg)   LMP 05/31/2022   SpO2 99%   BMI 33.48 kg/m    General: No  acute distress. Awake and conversant.  Eyes: Normal conjunctiva, anicteric. Round symmetric pupils.  ENT: Hearing grossly intact. No nasal discharge.  Neck: Neck is supple. No masses or thyromegaly.  Respiratory: Respirations are non-labored. No auditory wheezing.  Skin: Warm. No rashes or ulcers.  Psych: Alert and oriented. Cooperative, Appropriate mood and affect, Normal judgment.  CV: No cyanosis or JVD MSK: Normal strength and bilateral upper extremities, normal sensation throughout bilateral upper extremities, normal range of motion of wrist, Normal ambulation. No clubbing  Neuro: Sensation and CN II-XII grossly normal.   LABS: Lab work indicated that most results  were normal, with the exception of mildly low potassium at 3.4 and mildly elevated cholesterol.  No protein was detected in the urine, and creatinine levels were satisfactory.  IMAGING: Ultrasound of the neck nodule described it as partially cystic. No further imaging discussed during this encounter.  Office Visit on 05/23/2022  Component Date Value Ref Range Status   TSH 05/23/2022 2.25  0.35 - 5.50 uIU/mL Final   Cholesterol 05/23/2022 162  0 - 200 mg/dL Final   ATP III Classification       Desirable:  < 200 mg/dL               Borderline High:  200 - 239 mg/dL          High:  > = 240 mg/dL   Triglycerides 05/23/2022 99.0  0.0 - 149.0 mg/dL Final   Normal:  <150 mg/dLBorderline High:  150 - 199 mg/dL   HDL 05/23/2022 41.70  >39.00 mg/dL Final   VLDL 05/23/2022 19.8  0.0 - 40.0 mg/dL Final   LDL Cholesterol 05/23/2022 101 (H)  0 - 99 mg/dL Final   Total CHOL/HDL Ratio 05/23/2022 4   Final                  Men          Women1/2 Average Risk     3.4          3.3Average Risk          5.0          4.42X Average Risk          9.6          7.13X Average Risk          15.0          11.0                       NonHDL 05/23/2022 120.58   Final   NOTE:  Non-HDL goal should be 30 mg/dL higher than patient's LDL goal (i.e. LDL goal of <  70 mg/dL, would have non-HDL goal of < 100 mg/dL)   Hgb A1c MFr Bld 05/23/2022 6.2  4.6 - 6.5 % Final   Glycemic Control Guidelines for People with Diabetes:Non Diabetic:  <6%Goal of Therapy: <7%Additional Action Suggested:  >8%    Microalb, Ur 05/23/2022 12.7 (H)  0.0 - 1.9 mg/dL Final   Creatinine,U 05/23/2022 116.8  mg/dL Final   Microalb Creat Ratio 05/23/2022 10.9  0.0 - 30.0 mg/g Final   WBC 05/23/2022 5.5  4.0 - 10.5 K/uL Final   RBC 05/23/2022 5.21 (H)  3.87 - 5.11 Mil/uL Final   Hemoglobin 05/23/2022 13.2  12.0 - 15.0 g/dL Final   HCT 05/23/2022 39.9  36.0 - 46.0 % Final   MCV 05/23/2022 76.6 (L)  78.0 - 100.0 fl Final   MCHC 05/23/2022 33.0  30.0 - 36.0 g/dL Final   RDW 05/23/2022 13.8  11.5 - 15.5 % Final   Platelets 05/23/2022 400.0  150.0 - 400.0 K/uL Final   Neutrophils Relative % 05/23/2022 47.9  43.0 - 77.0 % Final   Lymphocytes Relative 05/23/2022 42.8  12.0 - 46.0 % Final   Monocytes Relative 05/23/2022 7.5  3.0 - 12.0 % Final   Eosinophils Relative 05/23/2022 1.2  0.0 - 5.0 % Final   Basophils Relative 05/23/2022 0.6  0.0 - 3.0 % Final   Neutro Abs 05/23/2022 2.6  1.4 - 7.7 K/uL Final  Lymphs Abs 05/23/2022 2.4  0.7 - 4.0 K/uL Final   Monocytes Absolute 05/23/2022 0.4  0.1 - 1.0 K/uL Final   Eosinophils Absolute 05/23/2022 0.1  0.0 - 0.7 K/uL Final   Basophils Absolute 05/23/2022 0.0  0.0 - 0.1 K/uL Final   Sodium 05/23/2022 137  135 - 145 mEq/L Final   Potassium 05/23/2022 3.4 (L)  3.5 - 5.1 mEq/L Final   Chloride 05/23/2022 101  96 - 112 mEq/L Final   CO2 05/23/2022 28  19 - 32 mEq/L Final   Glucose, Bld 05/23/2022 78  70 - 99 mg/dL Final   BUN 05/23/2022 12  6 - 23 mg/dL Final   Creatinine, Ser 05/23/2022 0.78  0.40 - 1.20 mg/dL Final   Total Bilirubin 05/23/2022 0.3  0.2 - 1.2 mg/dL Final   Alkaline Phosphatase 05/23/2022 91  39 - 117 U/L Final   AST 05/23/2022 18  0 - 37 U/L Final   ALT 05/23/2022 20  0 - 35 U/L Final   Total Protein 05/23/2022 7.2  6.0 -  8.3 g/dL Final   Albumin 05/23/2022 4.1  3.5 - 5.2 g/dL Final   GFR 05/23/2022 97.70  >60.00 mL/min Final   Calculated using the CKD-EPI Creatinine Equation (2021)   Calcium 05/23/2022 9.3  8.4 - 10.5 mg/dL Final   Lipase 05/23/2022 12.0  11.0 - 59.0 U/L Final        Alesia Banda, MD, MS

## 2022-07-06 ENCOUNTER — Ambulatory Visit: Payer: Self-pay | Admitting: Surgery

## 2022-07-06 DIAGNOSIS — R1011 Right upper quadrant pain: Secondary | ICD-10-CM | POA: Diagnosis not present

## 2022-07-06 DIAGNOSIS — K8020 Calculus of gallbladder without cholecystitis without obstruction: Secondary | ICD-10-CM | POA: Diagnosis not present

## 2022-07-06 NOTE — H&P (Signed)
History of Present Illness: Diamond Thompson is a 37 y.o. female who is seen today for follow up of gallstones. Since her last visit with me, she has started having RUQ abdominal pain. She continues to take her PPI regularly but this no longer provides relief of her pain. She estimates she has a few episodes of pain per month. It usually starts at night around 9-10pm and lasts for several hours. She denies diarrhea but does have constipation. She previously had a RUQ [place] in October 2023 that showed a large gallstone.     Review of Systems: A complete review of systems was obtained from the patient.  I have reviewed this information and discussed as appropriate with the patient.  See HPI as well for other ROS.         Medical History: Past Medical History History reviewed. No pertinent past medical history.    There is no problem list on file for this patient.     Past Surgical History History reviewed. No pertinent surgical history.     Allergies No Known Allergies    Current Outpatient Medications on File Prior to Visit Medication Sig Dispense Refill  amLODIPine-olmesartan (AZOR) 10-40 mg tablet Take 1 tablet by mouth once daily      omeprazole (PRILOSEC) 20 MG DR capsule Take 1 capsule by mouth once daily       No current facility-administered medications on file prior to visit.     Family History Family History Problem Relation Age of Onset  High blood pressure ([place]) Mother    Obesity Mother        Social History   Tobacco Use Smoking Status Never Smokeless Tobacco Never     Social History Social History    Socioeconomic History  Marital status: Married Tobacco Use  Smoking status: Never  Smokeless tobacco: Never Substance and Sexual Activity  Alcohol use: Not Currently  Drug use: Never      Objective:     Vitals:   07/06/22 1036 BP: (!) 140/88 Pulse: 74 Temp: 36.6 C (97.8 F) SpO2: 96% Weight: 93.5 kg (206 lb 3.2 oz)   Body  mass index is 40.27 kg/m.   Physical Exam Vitals reviewed.  Constitutional:      General: She is not in acute distress.    Appearance: Normal appearance.  HENT:     Head: Normocephalic and atraumatic.  Eyes:     General: No scleral icterus.    Conjunctiva/sclera: Conjunctivae normal.  Pulmonary:     Effort: Pulmonary effort is normal. No respiratory distress.  Skin:    General: Skin is warm and dry.  Neurological:     General: No focal deficit present.     Mental Status: She is alert and oriented to person, place, and time.              Assessment and Plan:    Diagnoses and all orders for this visit:   Symptomatic cholelithiasis -     CCS Case Posting Request; Future   RUQ pain       This is a 37 yo female with RUQ abdominal pain and cholelithiasis. Her pain previously responded to PPI treatment, but she is now having RUQ abdominal pain that is more consistent with biliary colic. She has known cholelithiasis. Laparoscopic cholecystectomy was recommended. The details of this procedure were discussed with the patient, including the risks of bleeding, infection, bile leak, post-cholecystectomy diarrhea, and <0.5% risk of common bile duct injury. The patient expressed  understanding and agrees to proceed with surgery. She will be contacted to schedule an elective surgery date.   Michaelle Birks, MD [facility] Surgery General, Hepatobiliary and Pancreatic Surgery 07/06/22 11:43 AM

## 2022-07-22 ENCOUNTER — Other Ambulatory Visit (HOSPITAL_COMMUNITY): Payer: Self-pay

## 2022-08-09 ENCOUNTER — Other Ambulatory Visit (HOSPITAL_BASED_OUTPATIENT_CLINIC_OR_DEPARTMENT_OTHER): Payer: Self-pay

## 2022-08-09 ENCOUNTER — Ambulatory Visit: Payer: 59 | Admitting: Family Medicine

## 2022-08-09 ENCOUNTER — Encounter: Payer: Self-pay | Admitting: Family Medicine

## 2022-08-09 VITALS — Temp 97.7°F | Wt 208.6 lb

## 2022-08-09 DIAGNOSIS — H6123 Impacted cerumen, bilateral: Secondary | ICD-10-CM | POA: Diagnosis not present

## 2022-08-09 DIAGNOSIS — H8303 Labyrinthitis, bilateral: Secondary | ICD-10-CM | POA: Diagnosis not present

## 2022-08-09 MED ORDER — PREDNISONE 50 MG PO TABS
ORAL_TABLET | ORAL | 0 refills | Status: DC
  Filled 2022-08-09: qty 5, 5d supply, fill #0

## 2022-08-09 MED ORDER — MECLIZINE HCL 25 MG PO TABS
12.5000 mg | ORAL_TABLET | Freq: Three times a day (TID) | ORAL | 0 refills | Status: DC | PRN
  Filled 2022-08-09: qty 30, 10d supply, fill #0

## 2022-08-09 MED ORDER — ACETIC ACID 2 % OT SOLN
OTIC | 0 refills | Status: DC
  Filled 2022-08-09: qty 15, 3d supply, fill #0

## 2022-08-09 NOTE — Progress Notes (Signed)
Assessment/Plan:   Problem List Items Addressed This Visit       Nervous and Auditory   Viral labyrinthitis of both ears    Patient presents with recent sinus issue and allergies, experiencing persistent dizziness likely due to labyrinthitis. Differential diagnosis:  Benign paroxysmal positional vertigo (BPPV) Vestibular neuritis Meniere's disease  Plan: Prescribe prednisone  Prescribe meclizine  Advise on rest and avoidance of sudden head movements. Monitor symptoms and return for follow-up if symptoms worsen or do not improve.      Relevant Medications   predniSONE (DELTASONE) 50 MG tablet   meclizine (ANTIVERT) 25 MG tablet   Bilateral impacted cerumen - Primary    The patient has impacted cerumen which could be contributing to dizziness. Prescribe acetic acid 2 % otic solution to help soften and remove earwax build-up. Instruct on how to safely use at-home.      Relevant Medications   acetic acid 2 % otic solution    There are no discontinued medications.  Return if symptoms worsen or fail to improve, for Dizziness.    Subjective:   Encounter date: 08/09/2022  Diamond Thompson is a 37 y.o. female who has Fibroids; Primary hypertension; Morbid obesity (HCC); Abnormal uterine bleeding (AUB); Generalized abdominal pain; Cholelithiasis; Neck mass; Right arm numbness; Viral labyrinthitis of both ears; and Bilateral impacted cerumen on their problem list..   She  has a past medical history of Allergy, Asthma, Clotting disorder (HCC) (2019), Fibroid, Medical history non-contributory, Primary hypertension (12/05/2021), and STD (sexually transmitted disease)..   CHIEF COMPLAINT:  Patient presents with a chief complaint of dizziness for the past 3 days, starting Monday, which occurs when sitting up and is not relieved by eating. HISTORY OF PRESENT ILLNESS:  Dizziness:  Patient recently got over the beginning of a sinus issue accompanied by allergies. The  dizziness began on Monday and persisted into Tuesday, despite attempts to alleviate it through eating. No specific movements such as twisting or standing were identified that provoke dizziness. The patient has been actively working out and reports mild shortness of breath with exertion. No chest pain, fever, chills, trouble swallowing, confusion, or weakness reported.  Review of Systems  Constitutional:  Negative for chills, fever and weight loss.  HENT:  Negative for ear pain and hearing loss.   Respiratory:  Positive for cough (Mild) and shortness of breath (With exertion).   Cardiovascular:  Negative for chest pain and palpitations.  Neurological:  Positive for dizziness. Negative for sensory change, speech change, focal weakness, seizures, loss of consciousness and weakness.       No confusion  All other systems reviewed and are negative.   Past Surgical History:  Procedure Laterality Date   NO PAST SURGERIES      Outpatient Medications Prior to Visit  Medication Sig Dispense Refill   amLODipine-olmesartan (AZOR) 10-40 MG tablet Take 1 tablet by mouth daily. 90 tablet 3   Loratadine (CLARITIN PO) Take by mouth.     omeprazole (PRILOSEC) 20 MG capsule Take 1 capsule (20 mg total) by mouth daily. 90 capsule 3   chlorhexidine (PERIDEX) 0.12 % solution SMARTSIG:By Mouth (Patient not taking: Reported on 08/09/2022)     No facility-administered medications prior to visit.    Family History  Problem Relation Age of Onset   Hypertension Mother    Hypertension Maternal Grandmother    Hypertension Paternal Grandmother    Diabetes Paternal Grandmother    Hypertension Father     Social History  Socioeconomic History   Marital status: Married    Spouse name: Not on file   Number of children: Not on file   Years of education: high school   Highest education level: Not on file  Occupational History   Not on file  Tobacco Use   Smoking status: Never    Passive exposure: Never    Smokeless tobacco: Never   Tobacco comments:    tired   Vaping Use   Vaping Use: Never used  Substance and Sexual Activity   Alcohol use: Not Currently   Drug use: Not Currently   Sexual activity: Yes    Partners: Male    Birth control/protection: None  Other Topics Concern   Not on file  Social History Narrative   Not on file   Social Determinants of Health   Financial Resource Strain: Not on file  Food Insecurity: Not on file  Transportation Needs: Not on file  Physical Activity: Not on file  Stress: Not on file  Social Connections: Not on file  Intimate Partner Violence: Not on file                                                                                                  Objective:  Physical Exam: Temp 97.7 F (36.5 C) (Temporal)   Wt 208 lb 9.6 oz (94.6 kg)   SpO2 99%   BMI 34.71 kg/m     Physical Exam Constitutional:      General: She is not in acute distress.    Appearance: Normal appearance. She is not ill-appearing or toxic-appearing.  HENT:     Head: Normocephalic and atraumatic.     Right Ear: Hearing and external ear normal. There is impacted cerumen.     Left Ear: Hearing and external ear normal. There is impacted cerumen.     Nose: Nose normal. No congestion or rhinorrhea.     Right Turbinates: Not enlarged.     Left Turbinates: Not enlarged.     Right Sinus: No maxillary sinus tenderness or frontal sinus tenderness.     Left Sinus: No maxillary sinus tenderness or frontal sinus tenderness.     Mouth/Throat:     Lips: Pink.     Mouth: Mucous membranes are moist.     Pharynx: Oropharynx is clear.     Tonsils: No tonsillar exudate.  Eyes:     General: No scleral icterus.    Extraocular Movements: Extraocular movements intact.     Pupils: Pupils are equal, round, and reactive to light.  Cardiovascular:     Rate and Rhythm: Normal rate and regular rhythm.     Pulses: Normal pulses.     Heart sounds: Normal heart sounds.  Pulmonary:      Effort: Pulmonary effort is normal. No respiratory distress.     Breath sounds: Normal breath sounds.  Abdominal:     General: Abdomen is flat. Bowel sounds are normal.     Palpations: Abdomen is soft.  Musculoskeletal:        General: Normal range of motion.  Lymphadenopathy:     Cervical:  No cervical adenopathy.  Skin:    General: Skin is warm and dry.     Findings: No rash.  Neurological:     General: No focal deficit present.     Mental Status: She is alert and oriented to person, place, and time. Mental status is at baseline.     Cranial Nerves: Cranial nerves 2-12 are intact.     Sensory: Sensation is intact.     Motor: Motor function is intact. No weakness, tremor or abnormal muscle tone.     Coordination: Coordination is intact.     Gait: Gait is intact.  Psychiatric:        Mood and Affect: Mood normal.        Behavior: Behavior normal.        Thought Content: Thought content normal.        Judgment: Judgment normal.      No data found.   US Soft Tissue Head/Neck (NON-THYROID)  Result Date: 05/27/2022 CLINICAL DATA:  Neck mass EXAM: ULTRASOUND OF HEAD/NECK SOFT TISSUES TECHNIQUE: Ultrasound examination of the head and neck soft tissues was performed in the area of clinical concern. COMPARISON:  None Available. FINDINGS: The palpable complaint reportedly at the posterior head is a 9 x 11 mm subcutaneous mass which has indistinct margination, a peripheral iso to hyperechoic rim, and central cystic appearance. No clear sinus tract to the skin to implicate a complicated dermal inclusion cyst. No regional edema or acute inflammation noted. IMPRESSION: Palpable complaint is a 9 x 11 mm subcutaneous mass that is partially cystic. No benign or specific imaging features, if no plan for resection consider diagnostic aspiration. Electronically Signed   By: Tiburcio Pea M.D.   On: 05/27/2022 04:45    Recent Results (from the past 2160 hour(s))  TSH     Status: None   Collection  Time: 05/23/22  2:18 PM  Result Value Ref Range   TSH 2.25 0.35 - 5.50 uIU/mL  Lipid panel     Status: Abnormal   Collection Time: 05/23/22  2:18 PM  Result Value Ref Range   Cholesterol 162 0 - 200 mg/dL    Comment: ATP III Classification       Desirable:  < 200 mg/dL               Borderline High:  200 - 239 mg/dL          High:  > = 161 mg/dL   Triglycerides 09.6 0.0 - 149.0 mg/dL    Comment: Normal:  <045 mg/dLBorderline High:  150 - 199 mg/dL   HDL 40.98 >11.91 mg/dL   VLDL 47.8 0.0 - 29.5 mg/dL   LDL Cholesterol 621 (H) 0 - 99 mg/dL   Total CHOL/HDL Ratio 4     Comment:                Men          Women1/2 Average Risk     3.4          3.3Average Risk          5.0          4.42X Average Risk          9.6          7.13X Average Risk          15.0          11.0  NonHDL 120.58     Comment: NOTE:  Non-HDL goal should be 30 mg/dL higher than patient's LDL goal (i.e. LDL goal of < 70 mg/dL, would have non-HDL goal of < 100 mg/dL)  Hemoglobin Z6X     Status: None   Collection Time: 05/23/22  2:18 PM  Result Value Ref Range   Hgb A1c MFr Bld 6.2 4.6 - 6.5 %    Comment: Glycemic Control Guidelines for People with Diabetes:Non Diabetic:  <6%Goal of Therapy: <7%Additional Action Suggested:  >8%   Microalbumin / creatinine urine ratio     Status: Abnormal   Collection Time: 05/23/22  2:18 PM  Result Value Ref Range   Microalb, Ur 12.7 (H) 0.0 - 1.9 mg/dL   Creatinine,U 096.0 mg/dL   Microalb Creat Ratio 10.9 0.0 - 30.0 mg/g  CBC with Differential/Platelet     Status: Abnormal   Collection Time: 05/23/22  2:18 PM  Result Value Ref Range   WBC 5.5 4.0 - 10.5 K/uL   RBC 5.21 (H) 3.87 - 5.11 Mil/uL   Hemoglobin 13.2 12.0 - 15.0 g/dL   HCT 45.4 09.8 - 11.9 %   MCV 76.6 (L) 78.0 - 100.0 fl   MCHC 33.0 30.0 - 36.0 g/dL   RDW 14.7 82.9 - 56.2 %   Platelets 400.0 150.0 - 400.0 K/uL   Neutrophils Relative % 47.9 43.0 - 77.0 %   Lymphocytes Relative 42.8 12.0 - 46.0 %    Monocytes Relative 7.5 3.0 - 12.0 %   Eosinophils Relative 1.2 0.0 - 5.0 %   Basophils Relative 0.6 0.0 - 3.0 %   Neutro Abs 2.6 1.4 - 7.7 K/uL   Lymphs Abs 2.4 0.7 - 4.0 K/uL   Monocytes Absolute 0.4 0.1 - 1.0 K/uL   Eosinophils Absolute 0.1 0.0 - 0.7 K/uL   Basophils Absolute 0.0 0.0 - 0.1 K/uL  Comprehensive metabolic panel     Status: Abnormal   Collection Time: 05/23/22  2:18 PM  Result Value Ref Range   Sodium 137 135 - 145 mEq/L   Potassium 3.4 (L) 3.5 - 5.1 mEq/L   Chloride 101 96 - 112 mEq/L   CO2 28 19 - 32 mEq/L   Glucose, Bld 78 70 - 99 mg/dL   BUN 12 6 - 23 mg/dL   Creatinine, Ser 1.30 0.40 - 1.20 mg/dL   Total Bilirubin 0.3 0.2 - 1.2 mg/dL   Alkaline Phosphatase 91 39 - 117 U/L   AST 18 0 - 37 U/L   ALT 20 0 - 35 U/L   Total Protein 7.2 6.0 - 8.3 g/dL   Albumin 4.1 3.5 - 5.2 g/dL   GFR 86.57 >84.69 mL/min    Comment: Calculated using the CKD-EPI Creatinine Equation (2021)   Calcium 9.3 8.4 - 10.5 mg/dL  Lipase     Status: None   Collection Time: 05/23/22  2:18 PM  Result Value Ref Range   Lipase 12.0 11.0 - 59.0 U/L  Comp Met (CMET)     Status: None   Collection Time: 06/05/22  9:24 AM  Result Value Ref Range   Sodium 140 135 - 145 mEq/L   Potassium 4.1 3.5 - 5.1 mEq/L   Chloride 102 96 - 112 mEq/L   CO2 30 19 - 32 mEq/L   Glucose, Bld 89 70 - 99 mg/dL   BUN 9 6 - 23 mg/dL   Creatinine, Ser 6.29 0.40 - 1.20 mg/dL   Total Bilirubin 0.4 0.2 - 1.2 mg/dL  Alkaline Phosphatase 109 39 - 117 U/L   AST 17 0 - 37 U/L   ALT 16 0 - 35 U/L   Total Protein 7.4 6.0 - 8.3 g/dL   Albumin 4.0 3.5 - 5.2 g/dL   GFR 16.10 >96.04 mL/min    Comment: Calculated using the CKD-EPI Creatinine Equation (2021)   Calcium 9.4 8.4 - 10.5 mg/dL        Garner Nash, MD, MS

## 2022-08-09 NOTE — Patient Instructions (Signed)
For dizziness, take prednisone and meclizine as prescribed.  For earwax, use acetic acid drops as needed.

## 2022-08-10 ENCOUNTER — Other Ambulatory Visit: Payer: Self-pay

## 2022-08-10 DIAGNOSIS — R220 Localized swelling, mass and lump, head: Secondary | ICD-10-CM | POA: Diagnosis not present

## 2022-08-15 DIAGNOSIS — H8303 Labyrinthitis, bilateral: Secondary | ICD-10-CM

## 2022-08-15 DIAGNOSIS — H6123 Impacted cerumen, bilateral: Secondary | ICD-10-CM | POA: Insufficient documentation

## 2022-08-15 HISTORY — DX: Labyrinthitis, bilateral: H83.03

## 2022-08-15 NOTE — Assessment & Plan Note (Signed)
The patient has impacted cerumen which could be contributing to dizziness. Prescribe acetic acid 2 % otic solution to help soften and remove earwax build-up. Instruct on how to safely use at-home.

## 2022-08-15 NOTE — Assessment & Plan Note (Signed)
Patient presents with recent sinus issue and allergies, experiencing persistent dizziness likely due to labyrinthitis. Differential diagnosis:  Benign paroxysmal positional vertigo (BPPV) Vestibular neuritis Meniere's disease  Plan: Prescribe prednisone  Prescribe meclizine  Advise on rest and avoidance of sudden head movements. Monitor symptoms and return for follow-up if symptoms worsen or do not improve.

## 2022-08-24 ENCOUNTER — Encounter (HOSPITAL_BASED_OUTPATIENT_CLINIC_OR_DEPARTMENT_OTHER): Payer: Self-pay | Admitting: Obstetrics & Gynecology

## 2022-08-24 ENCOUNTER — Ambulatory Visit (INDEPENDENT_AMBULATORY_CARE_PROVIDER_SITE_OTHER): Payer: 59 | Admitting: Obstetrics & Gynecology

## 2022-08-24 VITALS — BP 132/88 | HR 79 | Ht 60.0 in | Wt 209.0 lb

## 2022-08-24 DIAGNOSIS — D252 Subserosal leiomyoma of uterus: Secondary | ICD-10-CM

## 2022-08-24 DIAGNOSIS — R7303 Prediabetes: Secondary | ICD-10-CM | POA: Diagnosis not present

## 2022-08-24 DIAGNOSIS — Z124 Encounter for screening for malignant neoplasm of cervix: Secondary | ICD-10-CM

## 2022-08-24 DIAGNOSIS — I10 Essential (primary) hypertension: Secondary | ICD-10-CM

## 2022-08-24 DIAGNOSIS — N979 Female infertility, unspecified: Secondary | ICD-10-CM

## 2022-08-24 DIAGNOSIS — D251 Intramural leiomyoma of uterus: Secondary | ICD-10-CM | POA: Diagnosis not present

## 2022-08-24 DIAGNOSIS — N915 Oligomenorrhea, unspecified: Secondary | ICD-10-CM | POA: Diagnosis not present

## 2022-08-24 MED ORDER — MEDROXYPROGESTERONE ACETATE 10 MG PO TABS: 10.0000 mg | ORAL_TABLET | Freq: Every day | ORAL | 0 refills | Status: DC

## 2022-08-24 NOTE — Progress Notes (Signed)
GYNECOLOGY  VISIT  CC:   consult regarding fibroids  HPI: 37 y.o. G0P0000 Married [place] or [place] American female sent here for referral for discussion of fibroids.  Referring provider Dr. Fanny Bien.  Upon discussion, pt reports that she already has an ob/gyn, Dr. Estanislado Pandy, and has been seen in the past year.  Reports normal pap smear.  Will have pt sign release to get a copy of this.    Reports cycles are not regular.  Can skip months at a time.  When does have a cycle, flow is not particularly heavy but there is a lot of cramping.  Has taken provera a few times to start cycle.  Last cycle was about six weeks ago.  Flow did not last long.  Reviewed ultrasound findings with pt including images.  Uterus 8.6 x 4.7 x 4.4cm.  Subserosal fibroids 3.0cm and two small fibroids 1.9cm and 2.1cm.  Endometrium 5mm.   Ovaries normal with normal volume.  Discussed possible treatment options, however, with bigger issues being cycle regular and not flow or bulk symptoms, I don't feel she needs treatment at this time.  She is interested in conceiving.  Discussed causes of fertility issues.  In her case, feel oligo-ovulation is the cause.  Evaluation discussed as well as treatment with clomid or femara.  Pt very open to this.  Also recommended semen analysis as well for spouse.  Kit given with information for collection.  Order will be faxed to [facility] for completion of test.   Past Medical History:  Diagnosis Date   Allergy    Asthma    Fibroid    Infrequent menses 2019   Primary hypertension 12/05/2021   STD (sexually transmitted disease)    age 29?    MEDS:   Current Outpatient Medications on File Prior to Visit  Medication Sig Dispense Refill   acetic acid 2 % otic solution Apply 5 drops to affected ear with earwax 3 times a day for 3 days.  May repeat as often as needed. 15 mL 0   amLODipine-olmesartan (AZOR) 10-40 MG tablet Take 1 tablet by mouth daily. 90 tablet 3   Loratadine  (CLARITIN PO) Take by mouth.     meclizine (ANTIVERT) 25 MG tablet Take 0.5-1 tablets (12.5-25 mg total) by mouth 3 (three) times daily as needed for dizziness. 30 tablet 0   omeprazole (PRILOSEC) 20 MG capsule Take 1 capsule (20 mg total) by mouth daily. 90 capsule 3   chlorhexidine (PERIDEX) 0.12 % solution SMARTSIG:By Mouth (Patient not taking: Reported on 08/24/2022)     No current facility-administered medications on file prior to visit.    ALLERGIES: Fruit extracts, Kiwi extract, and Latex  SH:  married, non smoker  Review of Systems  Constitutional: Negative.   Genitourinary:        Irregular cycles    PHYSICAL EXAMINATION:    BP 132/88   Pulse 79   Ht 5' (1.524 m) Comment: Reported  Wt 209 lb (94.8 kg)   BMI 40.82 kg/m     General appearance: alert, cooperative and appears stated age No physical exam performed today  Assessment/Plan: 1. Oligomenorrhea, unspecified type - will have pt return for initial blood work.  If all normal, plan to proceed with fermara for improved and more regular ovulation.   - Follicle stimulating hormone; Future - Beta hCG quant (ref lab); Future - Prolactin; Future - pt will start provera 10mg  x 10 days now to induce cycle.  Has upcoming  gall bladder surgery and we discussed timing.  Feel it is reasonable to proceed at this time.  2. Primary female infertility - semen analysis kit also given for spouse to complete testing  3. Prediabetes - Insulin, random; Future  4. Primary hypertension - good control recommended  5. Intramural and subserous leiomyoma of uterus - at this time, do not feel treatment is needed.  However, if pt had miscarriage, would consider if myomectomy would be beneficial.   6.  Cervical cancer screening - release signed today for copy of Pap smear

## 2022-08-25 ENCOUNTER — Other Ambulatory Visit (HOSPITAL_BASED_OUTPATIENT_CLINIC_OR_DEPARTMENT_OTHER): Payer: 59

## 2022-08-25 ENCOUNTER — Encounter (HOSPITAL_BASED_OUTPATIENT_CLINIC_OR_DEPARTMENT_OTHER): Payer: Self-pay | Admitting: Obstetrics & Gynecology

## 2022-08-25 DIAGNOSIS — N915 Oligomenorrhea, unspecified: Secondary | ICD-10-CM

## 2022-08-25 DIAGNOSIS — R7303 Prediabetes: Secondary | ICD-10-CM | POA: Insufficient documentation

## 2022-08-25 DIAGNOSIS — D251 Intramural leiomyoma of uterus: Secondary | ICD-10-CM | POA: Insufficient documentation

## 2022-08-26 LAB — INSULIN, RANDOM: INSULIN: 31.5 u[IU]/mL — ABNORMAL HIGH (ref 2.6–24.9)

## 2022-08-26 LAB — BETA HCG QUANT (REF LAB): hCG Quant: <1 m[IU]/mL

## 2022-08-26 LAB — PROLACTIN: Prolactin: 23.7 ng/mL (ref 4.8–33.4)

## 2022-08-26 LAB — FOLLICLE STIMULATING HORMONE: FSH: 0.9 m[IU]/mL

## 2022-08-28 ENCOUNTER — Other Ambulatory Visit (HOSPITAL_BASED_OUTPATIENT_CLINIC_OR_DEPARTMENT_OTHER): Payer: Self-pay | Admitting: Obstetrics & Gynecology

## 2022-08-28 ENCOUNTER — Telehealth (HOSPITAL_BASED_OUTPATIENT_CLINIC_OR_DEPARTMENT_OTHER): Payer: Self-pay | Admitting: Obstetrics & Gynecology

## 2022-08-28 DIAGNOSIS — R7303 Prediabetes: Secondary | ICD-10-CM

## 2022-08-28 DIAGNOSIS — N915 Oligomenorrhea, unspecified: Secondary | ICD-10-CM

## 2022-08-28 MED ORDER — LETROZOLE 2.5 MG PO TABS: ORAL_TABLET | ORAL | 0 refills | Status: DC

## 2022-08-28 MED ORDER — METFORMIN HCL 500 MG PO TABS: ORAL_TABLET | ORAL | 1 refills | Status: DC

## 2022-08-28 NOTE — Telephone Encounter (Signed)
Called pt with test results.  Confirmed speaking with pt via two separtate identifiers.  Elevated insulin level and hb A1c of 6.2 on 05/23/2022 discussed.  Feel she should start metformin 500mg  with breakfast for 7 days and then increase to BID dosing.  She and spouse do want to try for pregnancy.  She started provera 10mg  x 10 days today.  With onset of cycle, will start femara 2.5mg  days 5-9 of cycle and check day 23 progesterone level.  Twin and triplet rate risk reviewed.  She has started a PNV and they have called to schedule semen analysis.    Referral to nutritional counseling also ordered as she would like help with better diet choices and nutrition in preparation for hopefully a pregnancy.  Asked pt to let me know when starts next cycle and when plans to start femara.  Rx's sent to pharmacy--[facility]. Confirmed location with pt.

## 2022-08-29 NOTE — Progress Notes (Signed)
Surgical Instructions    Your procedure is scheduled on Wednesday May 29th.  Report to [facility] Main Entrance "A" at 6:30 A.M., then check in with the Admitting office.  Call this number if you have problems the morning of surgery:  [phone]   If you have any questions prior to your surgery date call [phone]: Open Monday-Friday 8am-4pm If you experience any cold or flu symptoms such as cough, fever, chills, shortness of breath, etc. between now and your scheduled surgery, please notify us at the above number     Remember:  Do not eat after midnight the night before your surgery  You may drink clear liquids until 5:30am the morning of your surgery.   Clear liquids allowed are: Water, Non-Citrus Juices (without pulp), Carbonated Beverages, Clear Tea, Black Coffee ONLY (NO MILK, CREAM OR POWDERED CREAMER of any kind), and Gatorade    Take these medicines the morning of surgery with A SIP OF WATER: medroxyPROGESTERone (PROVERA) 10 MG tablet  letrozole (FEMARA) 2.5 MG tablet     As of today, STOP taking any Aspirin (unless otherwise instructed by your surgeon) Aleve, Naproxen, Ibuprofen, Motrin, Advil, Goody's, BC's, all herbal medications, fish oil, and all vitamins.           Do not wear jewelry or makeup. Do not wear lotions, powders, perfumes or deodorant. Do not shave 48 hours prior to surgery.   Do not bring valuables to the hospital. Do not wear nail polish, gel polish, artificial nails, or any other type of covering on natural nails (fingers and toes) If you have artificial nails or gel coating that need to be removed by a nail salon, please have this removed prior to surgery. Artificial nails or gel coating may interfere with anesthesia's ability to adequately monitor your vital signs.  [facility] is not responsible for any belongings or valuables.    Do NOT Smoke (Tobacco/Vaping)  24 hours prior to your procedure  If you use a CPAP at night, you may bring your  mask for your overnight stay.   Contacts, glasses, hearing aids, dentures or partials may not be worn into surgery, please bring cases for these belongings   For patients admitted to the hospital, discharge time will be determined by your treatment team.   Patients discharged the day of surgery will not be allowed to drive home, and someone needs to stay with them for 24 hours.   SURGICAL WAITING ROOM VISITATION Patients having surgery or a procedure may have no more than 2 support people in the waiting area - these visitors may rotate.   Children under the age of 56 must have an adult with them who is not the patient. If the patient needs to stay at the hospital during part of their recovery, the visitor guidelines for inpatient rooms apply. Pre-op nurse will coordinate an appropriate time for 1 support person to accompany patient in pre-op.  This support person may not rotate.   Please refer to [email or web address] for the visitor guidelines for Inpatients (after your surgery is over and you are in a regular room).    Special instructions:    Oral Hygiene is also important to reduce your risk of infection.  Remember - BRUSH YOUR TEETH THE MORNING OF SURGERY WITH YOUR REGULAR TOOTHPASTE   [facility]- Preparing For Surgery  Before surgery, you can play an important role. Because skin is not sterile, your skin needs to be as free of germs as possible. You can reduce  the number of germs on your skin by washing with CHG (chlorahexidine gluconate) Soap before surgery.  CHG is an antiseptic cleaner which kills germs and bonds with the skin to continue killing germs even after washing.     Please do not use if you have an allergy to CHG or antibacterial soaps. If your skin becomes reddened/irritated stop using the CHG.  Do not shave (including legs and underarms) for at least 48 hours prior to first CHG shower. It is OK to shave your  face.  Please follow these instructions carefully.     Shower the NIGHT BEFORE SURGERY and the MORNING OF SURGERY with CHG Soap.   If you chose to wash your hair, wash your hair first as usual with your normal shampoo. After you shampoo, rinse your hair and body thoroughly to remove the shampoo.  Then [employer] and genitals (private parts) with your normal soap and rinse thoroughly to remove soap.  After that Use CHG Soap as you would any other liquid soap. You can apply CHG directly to the skin and wash gently with a scrungie or a clean washcloth.   Apply the CHG Soap to your body ONLY FROM THE NECK DOWN.  Do not use on open wounds or open sores. Avoid contact with your eyes, ears, mouth and genitals (private parts). Wash Face and genitals (private parts)  with your normal soap.   Wash thoroughly, paying special attention to the area where your surgery will be performed.  Thoroughly rinse your body with warm water from the neck down.  DO NOT shower/wash with your normal soap after using and rinsing off the CHG Soap.  Pat yourself dry with a CLEAN TOWEL.  Wear CLEAN PAJAMAS to bed the night before surgery  Place CLEAN SHEETS on your bed the night before your surgery  DO NOT SLEEP WITH PETS.   Day of Surgery:  Take a shower with CHG soap. Wear Clean/Comfortable clothing the morning of surgery Do not apply any deodorants/lotions.   Remember to brush your teeth WITH YOUR REGULAR TOOTHPASTE.    If you received a COVID test during your pre-op visit, it is requested that you wear a mask when out in public, stay away from anyone that may not be feeling well, and notify your surgeon if you develop symptoms. If you have been in contact with anyone that has tested positive in the last 10 days, please notify your surgeon.    Please read over the following fact sheets that you were given.

## 2022-08-30 ENCOUNTER — Encounter (HOSPITAL_COMMUNITY): Payer: Self-pay

## 2022-08-30 ENCOUNTER — Other Ambulatory Visit: Payer: Self-pay

## 2022-08-30 ENCOUNTER — Encounter (HOSPITAL_COMMUNITY)
Admission: RE | Admit: 2022-08-30 | Discharge: 2022-08-30 | Disposition: A | Discharge status: Home / Self Care | Payer: 59 | Source: Ambulatory Visit | Attending: Surgery | Admitting: Surgery

## 2022-08-30 VITALS — BP 138/81 | HR 101 | Temp 98.5°F | Resp 18 | Ht 60.0 in | Wt 208.7 lb

## 2022-08-30 DIAGNOSIS — I2510 Atherosclerotic heart disease of native coronary artery without angina pectoris: Secondary | ICD-10-CM | POA: Insufficient documentation

## 2022-08-30 DIAGNOSIS — Z01818 Encounter for other preprocedural examination: Secondary | ICD-10-CM | POA: Diagnosis not present

## 2022-08-30 HISTORY — DX: Prediabetes: R73.03

## 2022-08-30 LAB — BASIC METABOLIC PANEL
Anion gap: 10 (ref 5–15)
BUN: 8 mg/dL (ref 6–20)
CO2: 24 mmol/L (ref 22–32)
Calcium: 9 mg/dL (ref 8.9–10.3)
Chloride: 103 mmol/L (ref 98–111)
Creatinine, Ser: 0.86 mg/dL (ref 0.44–1.00)
GFR, Estimated: >60 mL/min (ref >60)
Glucose, Bld: 89 mg/dL (ref 70–99)
Potassium: 3.3 mmol/L — ABNORMAL LOW (ref 3.5–5.1)
Sodium: 137 mmol/L (ref 135–145)

## 2022-08-30 LAB — CBC
HCT: 39.8 % (ref 36.0–46.0)
Hemoglobin: 12.5 g/dL (ref 12.0–15.0)
MCH: 24.9 pg — ABNORMAL LOW (ref 26.0–34.0)
MCHC: 31.4 g/dL (ref 30.0–36.0)
MCV: 79.3 fL — ABNORMAL LOW (ref 80.0–100.0)
Platelets: 437 10*3/uL — ABNORMAL HIGH (ref 150–400)
RBC: 5.02 MIL/uL (ref 3.87–5.11)
RDW: 13.7 % (ref 11.5–15.5)
WBC: 7.7 10*3/uL (ref 4.0–10.5)
nRBC: 0 % (ref 0.0–0.2)

## 2022-08-30 NOTE — Progress Notes (Signed)
PCP - Fanny Bien Cardiologist - denies  PPM/ICD - denies  Chest x-ray - N/A EKG - 08/30/2022  Stress Test - denies ECHO - denies Cardiac Cath - denies  Sleep Study - denies   Fasting Blood Sugar - pre-DM- pt states she does not check blood sugars at home and has not started her Metformin yet. Pt instructed not to take Metformin on the DOS if she does start taking.   Last dose of GLP1 agonist-  N/A  Blood Thinner Instructions: N/A Aspirin Instructions: pt instructed not to take Excedrin Migraine (containts ASA) starting today.   ERAS Protcol - ERAS per order   COVID TEST- N/A   Anesthesia review: no  Patient denies shortness of breath, fever, cough and chest pain at PAT appointment   All instructions explained to the patient, with a verbal understanding of the material. Patient agrees to go over the instructions while at home for a better understanding. The opportunity to ask questions was provided.

## 2022-09-05 ENCOUNTER — Ambulatory Visit: Payer: 59 | Admitting: Family Medicine

## 2022-09-05 NOTE — Anesthesia Preprocedure Evaluation (Signed)
Anesthesia Evaluation  Patient identified by MRN, date of birth, ID band Patient awake    Reviewed: Allergy & Precautions, NPO status , Patient's Chart, lab work & pertinent test results  Airway Mallampati: II       Dental no notable dental hx.    Pulmonary    Pulmonary exam normal        Cardiovascular hypertension, Pt. on medications Normal cardiovascular exam     Neuro/Psych    GI/Hepatic ,GERD  Medicated,,  Endo/Other  diabetes, Type 2, Oral Hypoglycemic Agents  Morbid obesity  Renal/GU      Musculoskeletal   Abdominal  (+) + obese  Peds  Hematology   Anesthesia Other Findings   Reproductive/Obstetrics                             Anesthesia Physical Anesthesia Plan  ASA: 3  Anesthesia Plan: General   Post-op Pain Management: Dilaudid IV   Induction:   PONV Risk Score and Plan: 4 or greater and Ondansetron, Midazolam and Treatment may vary due to age or medical condition  Airway Management Planned: Oral ETT  Additional Equipment: None  Intra-op Plan:   Post-operative Plan: Extubation in OR  Informed Consent:   Plan Discussed with:   Anesthesia Plan Comments:        Anesthesia Quick Evaluation

## 2022-09-06 ENCOUNTER — Ambulatory Visit (HOSPITAL_COMMUNITY): Payer: 59 | Admitting: Certified Registered"

## 2022-09-06 ENCOUNTER — Encounter (HOSPITAL_COMMUNITY)
Admission: RE | Disposition: A | Discharge status: Home / Self Care | Payer: Self-pay | Source: Home / Self Care | Attending: Surgery

## 2022-09-06 ENCOUNTER — Ambulatory Visit (HOSPITAL_COMMUNITY)
Admission: RE | Admit: 2022-09-06 | Discharge: 2022-09-06 | Disposition: A | Discharge status: Home / Self Care | Payer: 59 | Attending: Surgery | Admitting: Surgery

## 2022-09-06 ENCOUNTER — Other Ambulatory Visit: Payer: Self-pay

## 2022-09-06 ENCOUNTER — Encounter (HOSPITAL_COMMUNITY): Payer: Self-pay | Admitting: Surgery

## 2022-09-06 ENCOUNTER — Ambulatory Visit (HOSPITAL_BASED_OUTPATIENT_CLINIC_OR_DEPARTMENT_OTHER): Payer: 59 | Admitting: Certified Registered"

## 2022-09-06 DIAGNOSIS — K8020 Calculus of gallbladder without cholecystitis without obstruction: Secondary | ICD-10-CM

## 2022-09-06 DIAGNOSIS — R7303 Prediabetes: Secondary | ICD-10-CM | POA: Insufficient documentation

## 2022-09-06 DIAGNOSIS — Z7984 Long term (current) use of oral hypoglycemic drugs: Secondary | ICD-10-CM

## 2022-09-06 DIAGNOSIS — J45909 Unspecified asthma, uncomplicated: Secondary | ICD-10-CM | POA: Diagnosis not present

## 2022-09-06 DIAGNOSIS — I10 Essential (primary) hypertension: Secondary | ICD-10-CM

## 2022-09-06 DIAGNOSIS — E119 Type 2 diabetes mellitus without complications: Secondary | ICD-10-CM

## 2022-09-06 DIAGNOSIS — Z793 Long term (current) use of hormonal contraceptives: Secondary | ICD-10-CM | POA: Diagnosis not present

## 2022-09-06 DIAGNOSIS — Z79899 Other long term (current) drug therapy: Secondary | ICD-10-CM | POA: Diagnosis not present

## 2022-09-06 DIAGNOSIS — Z01818 Encounter for other preprocedural examination: Secondary | ICD-10-CM

## 2022-09-06 DIAGNOSIS — K8010 Calculus of gallbladder with chronic cholecystitis without obstruction: Secondary | ICD-10-CM | POA: Diagnosis not present

## 2022-09-06 HISTORY — PX: CHOLECYSTECTOMY: SHX55

## 2022-09-06 LAB — POCT PREGNANCY, URINE: Preg Test, Ur: NEGATIVE

## 2022-09-06 SURGERY — LAPAROSCOPIC CHOLECYSTECTOMY
Anesthesia: General | Site: Abdomen

## 2022-09-06 MED ORDER — MIDAZOLAM HCL 2 MG/2ML IJ SOLN
INTRAMUSCULAR | Status: AC
  Filled 2022-09-06: qty 2

## 2022-09-06 MED ORDER — FENTANYL CITRATE (PF) 100 MCG/2ML IJ SOLN
25.0000 ug | INTRAMUSCULAR | Status: DC | PRN
  Administered 2022-09-06: 25 ug via INTRAVENOUS

## 2022-09-06 MED ORDER — FENTANYL CITRATE (PF) 250 MCG/5ML IJ SOLN
INTRAMUSCULAR | Status: DC | PRN
  Administered 2022-09-06: 150 ug via INTRAVENOUS
  Administered 2022-09-06: 50 ug via INTRAVENOUS

## 2022-09-06 MED ORDER — DEXAMETHASONE SODIUM PHOSPHATE 10 MG/ML IJ SOLN
INTRAMUSCULAR | Status: DC | PRN
  Administered 2022-09-06: 5 mg via INTRAVENOUS

## 2022-09-06 MED ORDER — MEPERIDINE HCL 25 MG/ML IJ SOLN: 6.2500 mg | INTRAMUSCULAR | Status: DC | PRN

## 2022-09-06 MED ORDER — 0.9 % SODIUM CHLORIDE (POUR BTL) OPTIME
TOPICAL | Status: DC | PRN
  Administered 2022-09-06: 1000 mL

## 2022-09-06 MED ORDER — CELECOXIB 200 MG PO CAPS
200.0000 mg | ORAL_CAPSULE | ORAL | Status: AC
  Administered 2022-09-06: 200 mg via ORAL
  Filled 2022-09-06: qty 1

## 2022-09-06 MED ORDER — DEXMEDETOMIDINE HCL IN NACL 80 MCG/20ML IV SOLN
INTRAVENOUS | Status: DC | PRN
  Administered 2022-09-06 (×2): 8 ug via INTRAVENOUS

## 2022-09-06 MED ORDER — ROCURONIUM BROMIDE 10 MG/ML (PF) SYRINGE
PREFILLED_SYRINGE | INTRAVENOUS | Status: DC | PRN
  Administered 2022-09-06: 60 mg via INTRAVENOUS

## 2022-09-06 MED ORDER — ACETAMINOPHEN 325 MG PO TABS: 325.0000 mg | ORAL_TABLET | ORAL | Status: DC | PRN

## 2022-09-06 MED ORDER — FENTANYL CITRATE (PF) 100 MCG/2ML IJ SOLN
INTRAMUSCULAR | Status: AC
  Filled 2022-09-06: qty 2

## 2022-09-06 MED ORDER — DEXAMETHASONE SODIUM PHOSPHATE 10 MG/ML IJ SOLN
INTRAMUSCULAR | Status: AC
  Filled 2022-09-06: qty 1

## 2022-09-06 MED ORDER — ROCURONIUM BROMIDE 10 MG/ML (PF) SYRINGE
PREFILLED_SYRINGE | INTRAVENOUS | Status: AC
  Filled 2022-09-06: qty 10

## 2022-09-06 MED ORDER — OXYCODONE HCL 5 MG/5ML PO SOLN: 5.0000 mg | Freq: Once | ORAL | Status: AC | PRN

## 2022-09-06 MED ORDER — ORAL CARE MOUTH RINSE: 15.0000 mL | Freq: Once | OROMUCOSAL | Status: AC

## 2022-09-06 MED ORDER — EPHEDRINE 5 MG/ML INJ
INTRAVENOUS | Status: AC
  Filled 2022-09-06: qty 5

## 2022-09-06 MED ORDER — LACTATED RINGERS IV SOLN: INTRAVENOUS | Status: DC

## 2022-09-06 MED ORDER — KETOROLAC TROMETHAMINE 30 MG/ML IJ SOLN: 30.0000 mg | Freq: Once | INTRAMUSCULAR | Status: DC | PRN

## 2022-09-06 MED ORDER — PROPOFOL 10 MG/ML IV BOLUS
INTRAVENOUS | Status: DC | PRN
  Administered 2022-09-06: 180 mg via INTRAVENOUS

## 2022-09-06 MED ORDER — BUPIVACAINE-EPINEPHRINE (PF) 0.25% -1:200000 IJ SOLN
INTRAMUSCULAR | Status: AC
  Filled 2022-09-06: qty 30

## 2022-09-06 MED ORDER — KETOROLAC TROMETHAMINE 30 MG/ML IJ SOLN
INTRAMUSCULAR | Status: AC
  Filled 2022-09-06: qty 1

## 2022-09-06 MED ORDER — SODIUM CHLORIDE 0.9 % IR SOLN
Status: DC | PRN
  Administered 2022-09-06: 1000 mL

## 2022-09-06 MED ORDER — MIDAZOLAM HCL 2 MG/2ML IJ SOLN
INTRAMUSCULAR | Status: DC | PRN
  Administered 2022-09-06: 2 mg via INTRAVENOUS

## 2022-09-06 MED ORDER — ONDANSETRON HCL 4 MG/2ML IJ SOLN
INTRAMUSCULAR | Status: DC | PRN
  Administered 2022-09-06: 4 mg via INTRAVENOUS

## 2022-09-06 MED ORDER — OXYCODONE HCL 5 MG PO TABS
ORAL_TABLET | ORAL | Status: AC
  Filled 2022-09-06: qty 1

## 2022-09-06 MED ORDER — PROPOFOL 10 MG/ML IV BOLUS
INTRAVENOUS | Status: AC
  Filled 2022-09-06: qty 20

## 2022-09-06 MED ORDER — ONDANSETRON HCL 4 MG/2ML IJ SOLN
INTRAMUSCULAR | Status: AC
  Filled 2022-09-06: qty 2

## 2022-09-06 MED ORDER — CEFAZOLIN SODIUM-DEXTROSE 2-4 GM/100ML-% IV SOLN
2.0000 g | INTRAVENOUS | Status: AC
  Administered 2022-09-06: 2 g via INTRAVENOUS
  Filled 2022-09-06: qty 100

## 2022-09-06 MED ORDER — SUGAMMADEX SODIUM 200 MG/2ML IV SOLN
INTRAVENOUS | Status: DC | PRN
  Administered 2022-09-06: 200 mg via INTRAVENOUS

## 2022-09-06 MED ORDER — ACETAMINOPHEN 160 MG/5ML PO SOLN: 325.0000 mg | ORAL | Status: DC | PRN

## 2022-09-06 MED ORDER — ACETAMINOPHEN 500 MG PO TABS
1000.0000 mg | ORAL_TABLET | ORAL | Status: AC
  Administered 2022-09-06: 1000 mg via ORAL
  Filled 2022-09-06: qty 2

## 2022-09-06 MED ORDER — BUPIVACAINE-EPINEPHRINE 0.25% -1:200000 IJ SOLN
INTRAMUSCULAR | Status: DC | PRN
  Administered 2022-09-06: 28 mL

## 2022-09-06 MED ORDER — ONDANSETRON HCL 4 MG/2ML IJ SOLN: 4.0000 mg | Freq: Once | INTRAMUSCULAR | Status: DC | PRN

## 2022-09-06 MED ORDER — OXYCODONE HCL 5 MG PO TABS
5.0000 mg | ORAL_TABLET | Freq: Once | ORAL | Status: AC | PRN
  Administered 2022-09-06: 5 mg via ORAL

## 2022-09-06 MED ORDER — LIDOCAINE 2% (20 MG/ML) 5 ML SYRINGE
INTRAMUSCULAR | Status: AC
  Filled 2022-09-06: qty 5

## 2022-09-06 MED ORDER — CHLORHEXIDINE GLUCONATE 0.12 % MT SOLN
15.0000 mL | Freq: Once | OROMUCOSAL | Status: AC
  Administered 2022-09-06: 15 mL via OROMUCOSAL
  Filled 2022-09-06: qty 15

## 2022-09-06 MED ORDER — PHENYLEPHRINE 80 MCG/ML (10ML) SYRINGE FOR IV PUSH (FOR BLOOD PRESSURE SUPPORT)
PREFILLED_SYRINGE | INTRAVENOUS | Status: AC
  Filled 2022-09-06: qty 10

## 2022-09-06 MED ORDER — HYDROCODONE-ACETAMINOPHEN 5-325 MG PO TABS
1.0000 | ORAL_TABLET | Freq: Four times a day (QID) | ORAL | 0 refills | Status: AC | PRN
Start: 2022-09-06 — End: 2022-09-11

## 2022-09-06 MED ORDER — LIDOCAINE 2% (20 MG/ML) 5 ML SYRINGE
INTRAMUSCULAR | Status: DC | PRN
  Administered 2022-09-06: 100 mg via INTRAVENOUS

## 2022-09-06 MED ORDER — FENTANYL CITRATE (PF) 250 MCG/5ML IJ SOLN
INTRAMUSCULAR | Status: AC
  Filled 2022-09-06: qty 5

## 2022-09-06 SURGICAL SUPPLY — 41 items
ADH SKN CLS APL DERMABOND .7 (GAUZE/BANDAGES/DRESSINGS) ×1
APL PRP STRL LF DISP 70% ISPRP (MISCELLANEOUS) ×1
APPLIER CLIP 5 13 M/L LIGAMAX5 (MISCELLANEOUS) ×1
APR CLP MED LRG 5 ANG JAW (MISCELLANEOUS) ×1
BAG COUNTER SPONGE SURGICOUNT (BAG) ×1 IMPLANT
BAG SPEC RTRVL 10 TROC 200 (ENDOMECHANICALS) ×1
BAG SPNG CNTER NS LX DISP (BAG) ×1
CANISTER SUCT 3000ML PPV (MISCELLANEOUS) ×1 IMPLANT
CHLORAPREP W/TINT 26 (MISCELLANEOUS) ×1 IMPLANT
CLIP APPLIE 5 13 M/L LIGAMAX5 (MISCELLANEOUS) ×1 IMPLANT
COVER SURGICAL LIGHT HANDLE (MISCELLANEOUS) ×1 IMPLANT
DERMABOND ADVANCED .7 DNX12 (GAUZE/BANDAGES/DRESSINGS) ×1 IMPLANT
ELECT REM PT RETURN 9FT ADLT (ELECTROSURGICAL) ×1
ELECTRODE REM PT RTRN 9FT ADLT (ELECTROSURGICAL) ×1 IMPLANT
GLOVE BIOGEL PI IND STRL 6 (GLOVE) ×1 IMPLANT
GLOVE BIOGEL PI MICRO STRL 5.5 (GLOVE) ×1 IMPLANT
GOWN STRL REUS W/ TWL LRG LVL3 (GOWN DISPOSABLE) ×3 IMPLANT
GOWN STRL REUS W/TWL LRG LVL3 (GOWN DISPOSABLE) ×3
IRRIG SUCT STRYKERFLOW 2 WTIP (MISCELLANEOUS) ×1
IRRIGATION SUCT STRKRFLW 2 WTP (MISCELLANEOUS) ×1 IMPLANT
KIT BASIN OR (CUSTOM PROCEDURE TRAY) ×1 IMPLANT
KIT TURNOVER KIT B (KITS) ×1 IMPLANT
L-HOOK LAP DISP 36CM (ELECTROSURGICAL) ×1
LHOOK LAP DISP 36CM (ELECTROSURGICAL) ×1 IMPLANT
NDL INSUFFLATION 14GA 120MM (NEEDLE) IMPLANT
NEEDLE INSUFFLATION 14GA 120MM (NEEDLE) ×1 IMPLANT
NS IRRIG 1000ML POUR BTL (IV SOLUTION) ×1 IMPLANT
PAD ARMBOARD 7.5X6 YLW CONV (MISCELLANEOUS) ×1 IMPLANT
PENCIL BUTTON HOLSTER BLD 10FT (ELECTRODE) ×1 IMPLANT
POUCH RETRIEVAL ECOSAC 10 (ENDOMECHANICALS) IMPLANT
SCISSORS LAP 5X35 DISP (ENDOMECHANICALS) ×1 IMPLANT
SET TUBE SMOKE EVAC HIGH FLOW (TUBING) ×1 IMPLANT
SLEEVE Z-THREAD 5X100MM (TROCAR) ×2 IMPLANT
SUT MNCRL AB 4-0 PS2 18 (SUTURE) ×1 IMPLANT
SUT VICRYL 0 UR6 27IN ABS (SUTURE) IMPLANT
TOWEL GREEN STERILE (TOWEL DISPOSABLE) ×1 IMPLANT
TRAY LAPAROSCOPIC MC (CUSTOM PROCEDURE TRAY) ×1 IMPLANT
TROCAR BALLN 12MMX100 BLUNT (TROCAR) ×1 IMPLANT
TROCAR Z-THREAD OPTICAL 5X100M (TROCAR) ×1 IMPLANT
WARMER LAPAROSCOPE (MISCELLANEOUS) ×1 IMPLANT
WATER STERILE IRR 1000ML POUR (IV SOLUTION) ×1 IMPLANT

## 2022-09-06 NOTE — H&P (Signed)
Diamond Thompson is an 37 y.o. female.   Chief Complaint: RUQ pain HPI: Diamond Thompson is a 37 yo female with known cholelithiasis, who has been having RUQ abdominal pain. She has multiple episodes per month, which usually occur at night. She presents today for cholecystectomy.  Past Medical History:  Diagnosis Date   Allergy    Asthma    Fibroid    Infrequent menses 2019   Pre-diabetes    Primary hypertension 12/05/2021   STD (sexually transmitted disease)    age 12?    Past Surgical History:  Procedure Laterality Date   NO PAST SURGERIES      Family History  Problem Relation Age of Onset   Hypertension Mother    Hypertension Maternal Grandmother    Hypertension Paternal Grandmother    Diabetes Paternal Grandmother    Hypertension Father    Social History:  reports that she has never smoked. She has never been exposed to tobacco smoke. She has never used smokeless tobacco. She reports that she does not currently use alcohol. She reports that she does not currently use drugs.  Allergies:  Allergies  Allergen Reactions   Fruit Extracts Itching and Swelling    Raw fruit and vegetables.  Throat and tongue itching   Kiwi Extract Itching and Swelling    Raw fruits and veggies Throat and tongue itching   Latex Itching    Medications Prior to Admission  Medication Sig Dispense Refill   amLODipine-olmesartan (AZOR) 10-40 MG tablet Take 1 tablet by mouth daily. 90 tablet 3   chlorhexidine (PERIDEX) 0.12 % solution Use as directed 5 mLs in the mouth or throat daily as needed (bleeding).     medroxyPROGESTERone (PROVERA) 10 MG tablet Take 1 tablet (10 mg total) by mouth daily. 10 tablet 0   metFORMIN (GLUCOPHAGE) 500 MG tablet Take 1 tablet in am for 7 days and then increased to twice daily with meals. 60 tablet 1   Prenatal Vit-Fe Fumarate-FA (PRENATAL PO) Take 1 tablet by mouth daily.     acetic acid 2 % otic solution Apply 5 drops to affected ear with earwax 3 times a  day for 3 days.  May repeat as often as needed. (Patient not taking: Reported on 08/29/2022) 15 mL 0   aspirin-acetaminophen-caffeine (EXCEDRIN MIGRAINE) 250-250-65 MG tablet Take 1 tablet by mouth every 6 (six) hours as needed for headache.     letrozole (FEMARA) 2.5 MG tablet Take 1 tablet days 5-9 of menstrual cycle 5 tablet 0   meclizine (ANTIVERT) 25 MG tablet Take 0.5-1 tablets (12.5-25 mg total) by mouth 3 (three) times daily as needed for dizziness. (Patient not taking: Reported on 08/29/2022) 30 tablet 0   omeprazole (PRILOSEC) 20 MG capsule Take 1 capsule (20 mg total) by mouth daily. (Patient not taking: Reported on 08/29/2022) 90 capsule 3    No results found for this or any previous visit (from the past 48 hour(s)). No results found.  Review of Systems  Blood pressure 106/74, pulse 86, temperature 98.3 F (36.8 C), temperature source Oral, resp. rate 18, height 5' (1.524 m), weight 94.7 kg, SpO2 100 %. Physical Exam Vitals reviewed.  Constitutional:      General: She is not in acute distress.    Appearance: Normal appearance.  Eyes:     General: No scleral icterus.    Conjunctiva/sclera: Conjunctivae normal.  Pulmonary:     Effort: Pulmonary effort is normal. No respiratory distress.  Abdominal:  General: There is no distension.     Palpations: Abdomen is soft.     Tenderness: There is no abdominal tenderness.  Musculoskeletal:        General: Normal range of motion.  Skin:    General: Skin is warm and dry.  Neurological:     General: No focal deficit present.     Mental Status: She is alert and oriented to person, place, and time.      Assessment/Plan 37 yo female with symptomatic cholelithiasis. Proceed to OR for laparoscopic cholecystectomy. Surgical details were reviewed and informed consent obtained. Plan for discharge home from PACU.  Fritzi Mandes, MD 09/06/2022, 7:15 AM

## 2022-09-06 NOTE — Transfer of Care (Signed)
Immediate Anesthesia Transfer of Care Note  Patient: Diamond Thompson  Procedure(s) Performed: LAPAROSCOPIC CHOLECYSTECTOMY (Abdomen)  Patient Location: PACU  Anesthesia Type:General  Level of Consciousness: awake, alert , and drowsy  Airway & Oxygen Therapy: Patient Spontanous Breathing  Post-op Assessment: Report given to RN, Post -op Vital signs reviewed and stable, and Patient moving all extremities X 4  Post vital signs: Reviewed and stable  Last Vitals:  Vitals Value Taken Time  BP 124/85 09/06/22 0933  Temp    Pulse 83 09/06/22 0933  Resp 20 09/06/22 0933  SpO2 95 % 09/06/22 0933  Vitals shown include unvalidated device data.  Last Pain:  Vitals:   09/06/22 0717  TempSrc:   PainSc: 0-No pain         Complications: No notable events documented.

## 2022-09-06 NOTE — Op Note (Signed)
Date: 09/06/22  Patient: Diamond Thompson MRN: [id number]  Preoperative Diagnosis: Symptomatic cholelithiasis Postoperative Diagnosis: Same  Procedure: Laparoscopic cholecystectomy  Surgeon: Sophronia Simas, MD Assistant: Bernarda Caffey, MD (Resident)  EBL: 50 mL  Anesthesia: General endotracheal  Specimens: Gallbladder  Indications: Ms. Diamond Thompson is a 37 yo female who presented with worsening RUQ abdominal pain. A RUQ US showed multiple large stones within the gallbladder, as well as mild wall thickening. After a discussion of the risks and benefits of surgery, she agreed to proceed with cholecystectomy.  Findings: Multiple large gallstones with evidence of chronic cholecystitis.  Procedure details: Informed consent was obtained in the preoperative area prior to the procedure. The patient was brought to the operating room and placed on the table in the supine position. General anesthesia was induced and appropriate lines and drains were placed for intraoperative monitoring. Perioperative antibiotics were administered per SCIP guidelines. The abdomen was prepped and draped in the usual sterile fashion. A pre-procedure timeout was taken verifying patient identity, surgical site and procedure to be performed.  A small infraumbilical skin incision was made, the subcutaneous tissue was divided with cautery, and the umbilical stalk was grasped and elevated. A Veress needle was inserted through the fascia, intraperitoneal placement was confirmed with the saline drop test, and the abdomen was insufflated. A 12mm trocar was placed and the peritoneal cavity was inspected with no evidence of visceral or vascular injury. Three 5mm ports were placed in the right subcostal margin, all under direct visualization. The fundus of the gallbladder was grasped and retracted cephalad. The infundibulum was retracted laterally. There was mild thickening of the gallbladder wall and at least two large stones within  the gallbladder. Calot's node was identified. The cystic triangle was dissected out using cautery and blunt dissection, and the critical view of safety was obtained. The cystic duct and cystic artery were clipped and ligated. The lymph node was kept with the specimen. The gallbladder was taken off the liver using cautery, and the specimen was placed in an Ecosac and removed via the umbilical port site. This required extension of the fascial incision. The surgical site was irrigated with saline until the effluent was clear. Hemostasis was achieved in the gallbladder fossa using cautery. The cystic duct and artery stumps were visually inspected and there was no evidence of bile leak or bleeding. There was some welling of blood noted in the perihepatic space, and two small liver lacerations were identified on the right lateral liver. Hemostasis was achieved with cautery and no further bleeding was noted. The ports were removed under direct visualization and the abdomen was desufflated. The umbilical port site fascia was closed with 0 vicryl figure-of-eight sutures. The skin at all port sites was closed with 4-0 monocryl subcuticular suture. Dermabond was applied.  The patient tolerated the procedure well with no apparent complications. All counts were correct x2 at the end of the procedure. The patient was extubated and taken to PACU in stable condition.  Sophronia Simas, MD 09/06/22 9:38 AM

## 2022-09-06 NOTE — Anesthesia Procedure Notes (Signed)
Procedure Name: Intubation Date/Time: 09/06/2022 8:16 AM  Performed by: Aundria Rud, CRNAPre-anesthesia Checklist: Patient identified, Patient being monitored, Timeout performed, Emergency Drugs available and Suction available Patient Re-evaluated:Patient Re-evaluated prior to induction Oxygen Delivery Method: Circle system utilized Preoxygenation: Pre-oxygenation with 100% oxygen Induction Type: IV induction Ventilation: Mask ventilation without difficulty and Oral airway inserted - appropriate to patient size Laryngoscope Size: Mac and 3 Grade View: Grade II Tube type: Oral Tube size: 7.0 mm Number of attempts: 1 Airway Equipment and Method: Stylet Placement Confirmation: ETT inserted through vocal cords under direct vision, positive ETCO2 and breath sounds checked- equal and bilateral Secured at: 22 cm Tube secured with: Tape Dental Injury: Teeth and Oropharynx as per pre-operative assessment

## 2022-09-06 NOTE — Anesthesia Postprocedure Evaluation (Signed)
Anesthesia Post Note  Patient: Diamond Thompson  Procedure(s) Performed: LAPAROSCOPIC CHOLECYSTECTOMY (Abdomen)     Patient location during evaluation: PACU Anesthesia Type: General Level of consciousness: awake Pain management: pain level controlled Vital Signs Assessment: post-procedure vital signs reviewed and stable Respiratory status: spontaneous breathing Cardiovascular status: stable Postop Assessment: no apparent nausea or vomiting Anesthetic complications: no  No notable events documented.  Last Vitals:  Vitals:   09/06/22 1000 09/06/22 1015  BP: (!) 131/94 136/89  Pulse: 69 68  Resp: 15 15  Temp:  36.5 C  SpO2: 97% 94%    Last Pain:  Vitals:   09/06/22 1015  TempSrc:   PainSc: 1                  John F Terence Googe Jr

## 2022-09-06 NOTE — Discharge Instructions (Addendum)
[facility] DISCHARGE INSTRUCTIONS  Activity No heavy lifting greater than 15 pounds for 4 weeks after surgery. Ok to shower in 24 hours, but do not bathe or submerge incisions underwater. Do not drive while taking narcotic pain medication.  Wound Care Your incisions are covered with skin glue called Dermabond. This will peel off on its own over time. You may shower and allow warm soapy water to run over your incisions. Gently pat dry. Do not submerge your incision underwater. Monitor your incision for any new redness, tenderness, or drainage.  When to Call us: Fever greater than 100.5 New redness, drainage, or swelling at incision site Severe pain, nausea, or vomiting Jaundice (yellowing of the whites of the eyes or skin)  Follow-up You have an appointment scheduled with Dr. Freida Busman on September 29, 2022 at 9:30am. This will be at the [facility] office at [address], [place], [place]. Please arrive at least 15 minutes prior to your scheduled appointment time.  For questions or concerns, please call the office at [phone].

## 2022-09-07 ENCOUNTER — Encounter (HOSPITAL_COMMUNITY): Payer: Self-pay | Admitting: Surgery

## 2022-09-08 ENCOUNTER — Other Ambulatory Visit: Payer: Self-pay

## 2022-09-08 LAB — SURGICAL PATHOLOGY

## 2022-09-15 ENCOUNTER — Encounter (HOSPITAL_BASED_OUTPATIENT_CLINIC_OR_DEPARTMENT_OTHER): Payer: Self-pay | Admitting: Obstetrics & Gynecology

## 2022-09-18 ENCOUNTER — Ambulatory Visit: Payer: 59 | Admitting: Family Medicine

## 2022-09-18 ENCOUNTER — Other Ambulatory Visit (HOSPITAL_BASED_OUTPATIENT_CLINIC_OR_DEPARTMENT_OTHER): Payer: 59

## 2022-09-21 ENCOUNTER — Other Ambulatory Visit (HOSPITAL_BASED_OUTPATIENT_CLINIC_OR_DEPARTMENT_OTHER): Payer: Self-pay | Admitting: Obstetrics & Gynecology

## 2022-09-21 DIAGNOSIS — R7303 Prediabetes: Secondary | ICD-10-CM

## 2022-09-29 ENCOUNTER — Encounter: Payer: Self-pay | Admitting: Family Medicine

## 2022-09-29 ENCOUNTER — Ambulatory Visit (INDEPENDENT_AMBULATORY_CARE_PROVIDER_SITE_OTHER): Payer: 59 | Admitting: Family Medicine

## 2022-09-29 DIAGNOSIS — K219 Gastro-esophageal reflux disease without esophagitis: Secondary | ICD-10-CM | POA: Insufficient documentation

## 2022-09-29 DIAGNOSIS — Z9889 Other specified postprocedural states: Secondary | ICD-10-CM | POA: Insufficient documentation

## 2022-09-29 DIAGNOSIS — R221 Localized swelling, mass and lump, neck: Secondary | ICD-10-CM

## 2022-09-29 DIAGNOSIS — I10 Essential (primary) hypertension: Secondary | ICD-10-CM | POA: Diagnosis not present

## 2022-09-29 DIAGNOSIS — E282 Polycystic ovarian syndrome: Secondary | ICD-10-CM | POA: Diagnosis not present

## 2022-09-29 DIAGNOSIS — R1084 Generalized abdominal pain: Secondary | ICD-10-CM

## 2022-09-29 MED ORDER — AMLODIPINE-OLMESARTAN 10-40 MG PO TABS
1.0000 | ORAL_TABLET | Freq: Every day | ORAL | 3 refills | Status: DC
Start: 2022-09-29 — End: 2022-11-06

## 2022-09-29 NOTE — Assessment & Plan Note (Signed)
Partially cystic structure, stable, previously treated with doxycycline.  Plan: Observation as advised by ENT. Follow up with any changes or increased symptoms.

## 2022-09-29 NOTE — Assessment & Plan Note (Signed)
The patient's blood pressure is well controlled with the current regimen. No changes are indicated at this time.   Plan:  Continue Amlodipine 10 mg / Olmesartan 40 mg.

## 2022-09-29 NOTE — Progress Notes (Signed)
Assessment/Plan:   Problem List Items Addressed This Visit       Cardiovascular and Mediastinum   Primary hypertension    The patient's blood pressure is well controlled with the current regimen. No changes are indicated at this time.   Plan:  Continue Amlodipine 10 mg / Olmesartan 40 mg.      Relevant Medications   amLODipine-olmesartan (AZOR) 10-40 MG tablet   Other Relevant Orders   TSH   Lipid panel   Hemoglobin A1c   Microalbumin / creatinine urine ratio   Vitamin D 1,25 dihydroxy   CBC with Differential/Platelet   Comprehensive metabolic panel   Urinalysis w microscopic + reflex cultur     Digestive   Gastroesophageal reflux disease     Endocrine   PCOS (polycystic ovarian syndrome)    Managed by OB/GYN on metformin, stable lab results.  Plan: Continue metformin 500 mg.      Relevant Orders   TSH   Lipid panel   Hemoglobin A1c   Microalbumin / creatinine urine ratio   Vitamin D 1,25 dihydroxy   CBC with Differential/Platelet   Comprehensive metabolic panel   Urinalysis w microscopic + reflex cultur     Other   Morbid obesity (HCC) - Primary   Relevant Orders   Amb Referral to Bariatric Surgery   TSH   Lipid panel   Hemoglobin A1c   Microalbumin / creatinine urine ratio   Vitamin D 1,25 dihydroxy   CBC with Differential/Platelet   Comprehensive metabolic panel   Urinalysis w microscopic + reflex cultur   Neck mass    Partially cystic structure, stable, previously treated with doxycycline.  Plan: Observation as advised by ENT. Follow up with any changes or increased symptoms.      Status post surgery    Stable postoperative recovery with residual soreness.  Plan: Monitor for any signs of infection. Follow up on any persistent or worsening symptoms.      Other Visit Diagnoses     Generalized abdominal pain           Medications Discontinued During This Encounter  Medication Reason   meclizine (ANTIVERT) 25 MG tablet     letrozole (FEMARA) 2.5 MG tablet    acetic acid 2 % otic solution    medroxyPROGESTERone (PROVERA) 10 MG tablet    amLODipine-olmesartan (AZOR) 10-40 MG tablet Reorder    Return for physical (fasting labs 1 week before).    Subjective:   Encounter date: 09/29/2022  Diamond Thompson is a 37 y.o. female who has Primary hypertension; Morbid obesity (HCC); Abnormal uterine bleeding (AUB); Neck mass; Bilateral impacted cerumen; Intramural and subserous leiomyoma of uterus; Prediabetes; PCOS (polycystic ovarian syndrome); Gastroesophageal reflux disease; and Status post surgery on their problem list..   She  has a past medical history of Allergy, Asthma, Cholelithiasis (05/28/2022), Fibroid, Infrequent menses (2019), Pre-diabetes, Primary hypertension (12/05/2021), STD (sexually transmitted disease), and Viral labyrinthitis of both ears (08/15/2022)..   Chief Complaint: Follow-up on blood pressure and post-surgical recovery from gallbladder removal.  History of Present Illness:  Hypertension. Patient reports good blood pressure control with readings of 118/84 mmHg. No complaints of chest pain, shortness of breath, headaches, blurry vision, or leg swelling.  Post-Cholecystectomy Recovery. Patient underwent laparoscopic cholecystectomy. Reports feeling better with some residual abdominal soreness near the belly button. No fever, chills, or drainage noted.  Gastrointestinal Symptoms. Initially experienced postoperative diarrhea; however, this has since resolved. The patient continues to use omeprazole 20 mg PRN  for heartburn.  Polycystic Ovary Syndrome (PCOS). Patient is currently on metformin 500 mg for PCOS management. Recent lab results including renal function, hormone levels, and insulin were within normal limits.  Neck Mass. Patient reports a stable neck mass with cystic appearance treated previously with doxycycline. ENT follow-up indicated observation unless significant changes  occur.  Review of Systems  Constitutional:  Negative for chills, diaphoresis, fever, malaise/fatigue and weight loss.  HENT:  Negative for congestion, ear discharge, ear pain and hearing loss.   Eyes:  Negative for blurred vision, double vision, photophobia, pain, discharge and redness.  Respiratory:  Negative for cough, sputum production, shortness of breath and wheezing.   Cardiovascular:  Negative for chest pain and palpitations.  Gastrointestinal:  Positive for heartburn. Negative for abdominal pain, blood in stool, constipation, diarrhea, melena, nausea and vomiting.  Genitourinary:  Negative for dysuria, flank pain, frequency, hematuria and urgency.  Musculoskeletal:  Negative for myalgias.  Skin:  Negative for itching and rash.  Neurological:  Negative for dizziness, tingling, tremors, speech change, seizures, loss of consciousness, weakness and headaches.  Endo/Heme/Allergies:  Negative for polydipsia. Does not bruise/bleed easily.  Psychiatric/Behavioral:  Negative for depression, hallucinations, memory loss, substance abuse and suicidal ideas. The patient does not have insomnia.   All other systems reviewed and are negative.   Past Surgical History:  Procedure Laterality Date   CHOLECYSTECTOMY N/A 09/06/2022   Procedure: LAPAROSCOPIC CHOLECYSTECTOMY;  Surgeon: Fritzi Mandes, MD;  Location: [facility] OR;  Service: General;  Laterality: N/A;   NO PAST SURGERIES      Outpatient Medications Prior to Visit  Medication Sig Dispense Refill   aspirin-acetaminophen-caffeine (EXCEDRIN MIGRAINE) 250-250-65 MG tablet Take 1 tablet by mouth every 6 (six) hours as needed for headache.     chlorhexidine (PERIDEX) 0.12 % solution Use as directed 5 mLs in the mouth or throat daily as needed (bleeding).     metFORMIN (GLUCOPHAGE) 500 MG tablet TAKE 1 TABLET IN AM FOR 7 DAYS AND THEN INCREASED TO TWICE DAILY WITH MEALS. 180 tablet 1   omeprazole (PRILOSEC) 20 MG capsule Take 1 capsule (20 mg total) by  mouth daily. 90 capsule 3   Prenatal Vit-Fe Fumarate-FA (PRENATAL PO) Take 1 tablet by mouth daily.     amLODipine-olmesartan (AZOR) 10-40 MG tablet Take 1 tablet by mouth daily. 90 tablet 3   acetic acid 2 % otic solution Apply 5 drops to affected ear with earwax 3 times a day for 3 days.  May repeat as often as needed. (Patient not taking: Reported on 08/29/2022) 15 mL 0   letrozole (FEMARA) 2.5 MG tablet Take 1 tablet days 5-9 of menstrual cycle 5 tablet 0   meclizine (ANTIVERT) 25 MG tablet Take 0.5-1 tablets (12.5-25 mg total) by mouth 3 (three) times daily as needed for dizziness. (Patient not taking: Reported on 08/29/2022) 30 tablet 0   medroxyPROGESTERone (PROVERA) 10 MG tablet Take 1 tablet (10 mg total) by mouth daily. (Patient not taking: Reported on 09/29/2022) 10 tablet 0   No facility-administered medications prior to visit.    Family History  Problem Relation Age of Onset   Hypertension Mother    Hypertension Maternal Grandmother    Hypertension Paternal Grandmother    Diabetes Paternal Grandmother    Hypertension Father     Social History   Socioeconomic History   Marital status: Married    Spouse name: Not on file   Number of children: Not on file   Years of education: high school  Highest education level: 12th grade  Occupational History   Not on file  Tobacco Use   Smoking status: Never    Passive exposure: Never   Smokeless tobacco: Never   Tobacco comments:    tired   Vaping Use   Vaping Use: Never used  Substance and Sexual Activity   Alcohol use: Not Currently   Drug use: Not Currently   Sexual activity: Yes    Partners: Male    Birth control/protection: None  Other Topics Concern   Not on file  Social History Narrative   Not on file   Social Determinants of Health   Financial Resource Strain: Low Risk  (09/27/2022)   Overall Financial Resource Strain (CARDIA)    Difficulty of Paying Living Expenses: Not very hard  Food Insecurity: No Food  Insecurity (09/27/2022)   Hunger Vital Sign    Worried About Running Out of Food in the Last Year: Never true    Ran Out of Food in the Last Year: Never true  Transportation Needs: No Transportation Needs (09/27/2022)   PRAPARE - Administrator, Civil Service (Medical): No    Lack of Transportation (Non-Medical): No  Physical Activity: Insufficiently Active (09/27/2022)   Exercise Vital Sign    Days of Exercise per Week: 2 days    Minutes of Exercise per Session: 20 min  Stress: Stress Concern Present (09/27/2022)   [employer] of Occupational Health - Occupational Stress Questionnaire    Feeling of Stress : To some extent  Social Connections: Socially Integrated (09/27/2022)   Social Connection and Isolation Panel [NHANES]    Frequency of Communication with Friends and Family: More than three times a week    Frequency of Social Gatherings with Friends and Family: Twice a week    Attends Religious Services: More than 4 times per year    Active Member of [employer] or Organizations: Yes    Attends Engineer, structural: More than 4 times per year    Marital Status: Married  Catering manager Violence: Not on file                                                                                                  Objective:  Physical Exam: BP 118/84 (BP Location: Left Arm, Patient Position: Sitting, Cuff Size: Large)   Pulse 87   Temp 97.7 F (36.5 C) (Temporal)   Wt 205 lb (93 kg)   LMP 09/10/2022 (Within Months)   SpO2 98%   BMI 40.04 kg/m     Physical Exam Constitutional:      General: She is not in acute distress.    Appearance: Normal appearance. She is not ill-appearing or toxic-appearing.  HENT:     Head: Normocephalic and atraumatic.     Nose: Nose normal. No congestion.  Eyes:     General: No scleral icterus.    Extraocular Movements: Extraocular movements intact.  Neck:     Comments: Posterior mass, 8 mm Cardiovascular:     Rate and Rhythm:  Normal rate and regular rhythm.     Pulses: Normal  pulses.     Heart sounds: Normal heart sounds.  Pulmonary:     Effort: Pulmonary effort is normal. No respiratory distress.     Breath sounds: Normal breath sounds.  Abdominal:     General: Abdomen is flat. Bowel sounds are normal.     Palpations: Abdomen is soft.  Musculoskeletal:        General: Normal range of motion.  Skin:    General: Skin is warm and dry.     Findings: No rash.  Neurological:     General: No focal deficit present.     Mental Status: She is alert and oriented to person, place, and time. Mental status is at baseline.  Psychiatric:        Mood and Affect: Mood normal.        Behavior: Behavior normal.        Thought Content: Thought content normal.        Judgment: Judgment normal.     No results found.  Recent Results (from the past 2160 hour(s))  Insulin, random     Status: Abnormal   Collection Time: 08/25/22  8:08 AM  Result Value Ref Range   INSULIN 31.5 (H) 2.6 - 24.9 uIU/mL  Prolactin     Status: None   Collection Time: 08/25/22  8:08 AM  Result Value Ref Range   Prolactin 23.7 4.8 - 33.4 ng/mL  Beta hCG quant (ref lab)     Status: None   Collection Time: 08/25/22  8:08 AM  Result Value Ref Range   hCG Quant <1 mIU/mL    Comment:                      Female (Non-pregnant)    0 -     5                             (Postmenopausal)  0 -     8                      Female (Pregnant)                      Weeks of Gestation                              3                6 -    71                              4               10 -   750                              5              217 -  7138                              6              158 - [phone]  7             [id number] [id number]                              8            32065 -[phone]                              9            [id number] -[phone]                             10            [id number] -[id number]                             12             27832 -[id number]                             14            13950 - 62530                             15            12039 - 70971                             16             9040 - 56451                             17             [phone] Roche E CLIA methodology   Follicle stimulating hormone     Status: None   Collection Time: 08/25/22  8:08 AM  Result Value Ref Range   FSH 0.9 mIU/mL    Comment:                      Adult Female             Range                       Follicular phase      3.5 -  12.5                       Ovulation phase       4.7 -  21.5                       Luteal phase          1.7 -   7.7  Postmenopausal       25.8 - 134.8   CBC per protocol     Status: Abnormal   Collection Time: 08/30/22  2:42 PM  Result Value Ref Range   WBC 7.7 4.0 - 10.5 K/uL   RBC 5.02 3.87 - 5.11 MIL/uL   Hemoglobin 12.5 12.0 - 15.0 g/dL   HCT 56.2 13.0 - 86.5 %   MCV 79.3 (L) 80.0 - 100.0 fL   MCH 24.9 (L) 26.0 - 34.0 pg   MCHC 31.4 30.0 - 36.0 g/dL   RDW 78.4 69.6 - 29.5 %   Platelets 437 (H) 150 - 400 K/uL   nRBC 0.0 0.0 - 0.2 %    Comment: Performed at [facility], [address], [place], [place] [address]  Basic metabolic panel per protocol     Status: Abnormal   Collection Time: 08/30/22  2:42 PM  Result Value Ref Range   Sodium 137 135 - 145 mmol/L   Potassium 3.3 (L) 3.5 - 5.1 mmol/L   Chloride 103 98 - 111 mmol/L   CO2 24 22 - 32 mmol/L   Glucose, Bld 89 70 - 99 mg/dL    Comment: Glucose reference range applies only to samples taken after fasting for at least 8 hours.   BUN 8 6 - 20 mg/dL   Creatinine, Ser 2.44 0.44 - 1.00 mg/dL   Calcium 9.0 8.9 - 01.0 mg/dL   GFR, Estimated >27 >25 mL/min    Comment: (NOTE) Calculated using the CKD-EPI Creatinine Equation (2021)    Anion gap 10 5 - 15    Comment: Performed at [facility], [address], [place], [place]  [address]  Pregnancy, urine POC     Status: None   Collection Time: 09/06/22  7:48 AM  Result Value Ref Range   Preg Test, Ur NEGATIVE NEGATIVE    Comment:        THE SENSITIVITY OF THIS METHODOLOGY IS >24 mIU/mL   Surgical pathology     Status: None   Collection Time: 09/06/22  8:46 AM  Result Value Ref Range   SURGICAL PATHOLOGY      SURGICAL PATHOLOGY CASE: [id number] PATIENT: Gilma TURNER GREEN Surgical Pathology Report     Clinical History: gallstones (cm)     FINAL MICROSCOPIC DIAGNOSIS:  A. GALLBLADDER, CHOLECYSTECTOMY:      Chronic cholecystitis.      Cholelithiasis.      One benign reactive lymph node.   GROSS DESCRIPTION:  Size/?Intact: An intact gallbladder measuring 7.5 x 3.5 x 3.0 cm Serosal surface: Tan-pink, smooth, hyperemic Mucosa/Wall: Mucosa is tan-pink and trabeculated, and the wall measures up to 0.4 cm in thickness Contents: The gallbladder contains an abundant amount of brown-green thickened bile, a small amount of black gritty material, and 2 tan-yellow bosselated calculi measuring 1.7 cm each Cystic duct: 0.4 cm in diameter.  Adjacent to the neck of the gallbladder there is a 1.4 x 0.9 x 0.5 cm pink-purple possible lymph node identified. Block Summary: 1 block submitted  Lovey Newcomer 09/06/2022)    Final Diagnosis performed by Lance Coon, MD.   Electronically s igned 09/08/2022 Technical and / or Professional components performed at [facility]. [facility], [address] [place], [place] [address].  Immunohistochemistry Technical component (if applicable) was performed at [facility]. [address], [address], [place], [place] [address].   IMMUNOHISTOCHEMISTRY DISCLAIMER (if applicable): Some of these immunohistochemical stains may have been developed and the performance  characteristics determine by [facility]. Some may not have been cleared or approved by the U.S. Food and Drug Administration. The FDA  has determined that such clearance or approval is not necessary. This test is used for clinical purposes. It should not be regarded as investigational or for research. This laboratory is certified under the Clinical Laboratory Improvement Amendments of 1988 (CLIA-88) as qualified to perform high complexity clinical laboratory testing.  The controls stained appropriately.   IHC stains are per formed on formalin fixed, paraffin embedded tissue using a 3,3"diaminobenzidine (DAB) chromogen and Leica Bond Autostainer System. The staining intensity of the nucleus is score manually and is reported as the percentage of tumor cell nuclei demonstrating specific nuclear staining. The specimens are fixed in 10% Neutral Formalin for at least 6 hours and up to 72hrs. These tests are validated on decalcified tissue. Results should be interpreted with caution given the possibility of false negative results on decalcified specimens. Antibody Clones are as follows ER-clone 36F, PR-clone 16, Ki67- clone MM1. Some of these immunohistochemical stains may have been developed and the performance characteristics determined by [facility].         Garner Nash, MD, MS

## 2022-09-29 NOTE — Assessment & Plan Note (Signed)
Stable postoperative recovery with residual soreness.  Plan: Monitor for any signs of infection. Follow up on any persistent or worsening symptoms.

## 2022-09-29 NOTE — Assessment & Plan Note (Signed)
Managed by OB/GYN on metformin, stable lab results.  Plan: Continue metformin 500 mg.

## 2022-10-02 ENCOUNTER — Other Ambulatory Visit (HOSPITAL_BASED_OUTPATIENT_CLINIC_OR_DEPARTMENT_OTHER): Payer: Self-pay | Admitting: *Deleted

## 2022-10-02 ENCOUNTER — Other Ambulatory Visit (HOSPITAL_BASED_OUTPATIENT_CLINIC_OR_DEPARTMENT_OTHER): Payer: 59

## 2022-10-02 DIAGNOSIS — N979 Female infertility, unspecified: Secondary | ICD-10-CM

## 2022-10-03 LAB — PROGESTERONE: Progesterone: 7.1 ng/mL

## 2022-10-06 ENCOUNTER — Other Ambulatory Visit (HOSPITAL_BASED_OUTPATIENT_CLINIC_OR_DEPARTMENT_OTHER): Payer: Self-pay | Admitting: Obstetrics & Gynecology

## 2022-10-06 ENCOUNTER — Other Ambulatory Visit: Payer: Self-pay | Admitting: Family Medicine

## 2022-10-06 ENCOUNTER — Other Ambulatory Visit (HOSPITAL_COMMUNITY): Payer: Self-pay

## 2022-10-06 DIAGNOSIS — N979 Female infertility, unspecified: Secondary | ICD-10-CM

## 2022-10-06 MED ORDER — LETROZOLE 2.5 MG PO TABS
ORAL_TABLET | ORAL | 0 refills | Status: DC
Start: 2022-10-06 — End: 2022-10-29
  Filled 2022-10-07: qty 5, 5d supply, fill #0

## 2022-10-07 ENCOUNTER — Other Ambulatory Visit (HOSPITAL_COMMUNITY): Payer: Self-pay

## 2022-10-09 ENCOUNTER — Other Ambulatory Visit (HOSPITAL_COMMUNITY): Payer: Self-pay

## 2022-10-09 MED ORDER — CHLORHEXIDINE GLUCONATE 0.12 % MT SOLN
5.0000 mL | Freq: Every day | OROMUCOSAL | 3 refills | Status: DC | PRN
  Filled 2022-10-09: qty 473, 15d supply, fill #0

## 2022-10-18 ENCOUNTER — Other Ambulatory Visit (HOSPITAL_COMMUNITY): Payer: Self-pay

## 2022-10-27 ENCOUNTER — Other Ambulatory Visit: Payer: Self-pay | Admitting: Obstetrics & Gynecology

## 2022-10-27 ENCOUNTER — Other Ambulatory Visit (HOSPITAL_BASED_OUTPATIENT_CLINIC_OR_DEPARTMENT_OTHER): Payer: 59

## 2022-10-27 DIAGNOSIS — N979 Female infertility, unspecified: Secondary | ICD-10-CM

## 2022-10-28 LAB — PROGESTERONE: Progesterone: 10.4 ng/mL

## 2022-10-29 ENCOUNTER — Other Ambulatory Visit (HOSPITAL_BASED_OUTPATIENT_CLINIC_OR_DEPARTMENT_OTHER): Payer: Self-pay | Admitting: Obstetrics & Gynecology

## 2022-10-29 DIAGNOSIS — N979 Female infertility, unspecified: Secondary | ICD-10-CM

## 2022-10-29 MED ORDER — LETROZOLE 2.5 MG PO TABS
ORAL_TABLET | ORAL | 0 refills | Status: DC
Start: 2022-10-29 — End: 2022-12-02

## 2022-10-30 ENCOUNTER — Other Ambulatory Visit (HOSPITAL_COMMUNITY): Payer: Self-pay

## 2022-10-31 ENCOUNTER — Encounter: Payer: 59 | Attending: Obstetrics & Gynecology | Admitting: Skilled Nursing Facility1

## 2022-10-31 ENCOUNTER — Encounter: Payer: Self-pay | Admitting: Skilled Nursing Facility1

## 2022-10-31 VITALS — Ht 62.0 in | Wt 208.3 lb

## 2022-10-31 DIAGNOSIS — E631 Imbalance of constituents of food intake: Secondary | ICD-10-CM | POA: Insufficient documentation

## 2022-10-31 NOTE — Progress Notes (Signed)
Medical Nutrition Therapy   Primary concerns today: prediabetes   Referral diagnosis: [employer] employee   NUTRITION ASSESSMENT   Clinical Medical Hx: prediabetes  Medications: see list; metformin  Labs: 6.2 Notable Signs/Symptoms: N/A  Lifestyle & Dietary Hx  Pt states she was just diagnosed with prediabetes and wants to discuss that as well as weight loss.  Pt states she wants to conceive as well.  Pt states she is currently watching her niece for the for seeable future.  Pt states she works in the [employer]. Pt states she is allergic to raw fruits and vegetables.    Estimated daily fluid intake:  Supplements:  Sleep:  Stress / self-care:  Current average weekly physical activity:   24-Hr Dietary Recall First Meal: Snack 9:15: bacon and grits and biscuit or eggs Second Meal: skipped  Snack:  Third Meal: fast food or  Snack: mcdonalds ice cream  Beverages: water, juice, soda  NUTRITION INTERVENTION  Nutrition education (E-1) on the following topics:  Creation of balanced and diverse meals to increase the intake of nutrient-rich foods that provide essential vitamins, minerals, fiber, and phytonutrients Variety of Fruits and Vegetables: Aim for a colorful array of fruits and vegetables to ensure a wide range of nutrients. Include a mix of leafy greens, berries, citrus fruits, cruciferous vegetables, and more. Whole Grains: Choose whole grains over refined grains. Examples include brown rice, quinoa, oats, whole wheat, and barley. Lean Proteins: Include lean sources of protein, such as poultry, fish, tofu, legumes, beans, lentils, and low-fat dairy products. Limit red and processed meats. Healthy Fats: Incorporate sources of healthy fats, including avocados, nuts, seeds, and olive oil. Limit saturated and trans fats found in fried and processed foods. Dairy or Dairy Alternatives: Choose low-fat or fat-free dairy products, or plant-based alternatives like  almond or soy milk. Portion Control: Be mindful of portion sizes to avoid overeating. Pay attention to hunger and satisfaction cues. Limit Added Sugars: Minimize the consumption of sugary beverages, snacks, and desserts. Check food labels for added sugars and opt for natural sources of sweetness such as whole fruits. Hydration: Drink plenty of water throughout the day. Limit sugary drinks and excessive caffeine intake. Moderate Sodium Intake: Reduce the consumption of high-sodium foods. Use herbs and spices for flavor instead of excessive salt. Meal Planning and Preparation: Plan and prepare meals ahead of time to make healthier choices more convenient. Include a mix of food groups in each meal. Limit Processed Foods: Minimize the intake of highly processed and packaged foods that are often high in added sugars, salt, and unhealthy fats. Regular Physical Activity: Combine a healthy diet with regular physical activity for overall well-being. Aim for at least 150 minutes of moderate-intensity aerobic exercise per week, along with strength training. Moderation and Balance: Enjoy treats and indulgent foods in moderation, emphasizing balance rather than strict restriction.  Handouts Provided Include  Detailed MyPlate  Learning Style & Readiness for Change Teaching method utilized: Visual & Auditory  Demonstrated degree of understanding via: Teach Back  Barriers to learning/adherence to lifestyle change: none identified   Goals Established by Pt Limit fast food to once a week Eat lunch daily  Limit the McDonalds ice cream   MONITORING & EVALUATION Dietary intake, weekly physical activity  Next Steps  Patient is to follow up for second visit.

## 2022-11-06 ENCOUNTER — Other Ambulatory Visit (HOSPITAL_COMMUNITY): Payer: Self-pay

## 2022-11-06 ENCOUNTER — Other Ambulatory Visit: Payer: Self-pay | Admitting: Family Medicine

## 2022-11-06 DIAGNOSIS — I10 Essential (primary) hypertension: Secondary | ICD-10-CM

## 2022-11-06 MED ORDER — AMLODIPINE-OLMESARTAN 10-40 MG PO TABS
1.0000 | ORAL_TABLET | Freq: Every day | ORAL | 3 refills | Status: DC
Start: 2022-11-06 — End: 2023-02-09
  Filled 2022-11-06: qty 90, 90d supply, fill #0

## 2022-11-06 NOTE — Telephone Encounter (Signed)
Chart supports rx. Last OV: 09/29/2022

## 2022-11-20 ENCOUNTER — Encounter: Payer: 59 | Attending: Obstetrics & Gynecology | Admitting: Skilled Nursing Facility1

## 2022-11-20 ENCOUNTER — Encounter: Payer: Self-pay | Admitting: Skilled Nursing Facility1

## 2022-11-20 NOTE — Progress Notes (Signed)
Medical Nutrition Therapy   Primary concerns today: prediabetes   Referral diagnosis: [employer] employee   NUTRITION ASSESSMENT   Clinical Medical Hx: prediabetes  Medications: see list; metformin  Labs: A1C 6.2 Notable Signs/Symptoms: N/A  Lifestyle & Dietary Hx  Pt states she is allergic to raw fruits and vegetables.    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  Pt states she has cut back on fast food, bringing more foods from home.     Estimated daily fluid intake:  Supplements:  Sleep:  Stress / self-care:  Current average weekly physical activity: arm chair workouts   24-Hr Dietary Recall First Meal: Snack 9:15: pineapples and grapes Second Meal 2-2:30: skipped or 2 chicken tender strips on bread  Snack:  Third Meal: chicken + greens + potato salad  Snack:  Beverages: water, juice, soda, sweet tea, vitamin water  NUTRITION INTERVENTION  Nutrition education (E-1) on the following topics:  Creation of balanced and diverse meals to increase the intake of nutrient-rich foods that provide essential vitamins, minerals, fiber, and phytonutrients Variety of Fruits and Vegetables: Aim for a colorful array of fruits and vegetables to ensure a wide range of nutrients. Include a mix of leafy greens, berries, citrus fruits, cruciferous vegetables, and more. Whole Grains: Choose whole grains over refined grains. Examples include brown rice, quinoa, oats, whole wheat, and barley. Lean Proteins: Include lean sources of protein, such as poultry, fish, tofu, legumes, beans, lentils, and low-fat dairy products. Limit red and processed meats. Healthy Fats: Incorporate sources of healthy fats, including avocados, nuts, seeds, and olive oil. Limit saturated and trans fats found in fried and processed foods. Dairy or Dairy Alternatives: Choose low-fat or fat-free dairy products, or  plant-based alternatives like almond or soy milk. Portion Control: Be mindful of portion sizes to avoid overeating. Pay attention to hunger and satisfaction cues. Limit Added Sugars: Minimize the consumption of sugary beverages, snacks, and desserts. Check food labels for added sugars and opt for natural sources of sweetness such as whole fruits. Hydration: Drink plenty of water throughout the day. Limit sugary drinks and excessive caffeine intake. Moderate Sodium Intake: Reduce the consumption of high-sodium foods. Use herbs and spices for flavor instead of excessive salt. Meal Planning and Preparation: Plan and prepare meals ahead of time to make healthier choices more convenient. Include a mix of food groups in each meal. Limit Processed Foods: Minimize the intake of highly processed and packaged foods that are often high in added sugars, salt, and unhealthy fats. Regular Physical Activity: Combine a healthy diet with regular physical activity for overall well-being. Aim for at least 150 minutes of moderate-intensity aerobic exercise per week, along with strength training. Moderation and Balance: Enjoy treats and indulgent foods in moderation, emphasizing balance rather than strict restriction.  Handouts Provided Include  Detailed MyPlate  Learning Style & Readiness for Change Teaching method utilized: Visual & Auditory  Demonstrated degree of understanding via: Teach Back  Barriers to learning/adherence to lifestyle change: none identified   Goals Established by Pt Limit fast food to once a week: states she was about 90% successful  Eat lunch daily: 100% Limit the [employer] ice cream: 100% NEW: be consistent with your goals  NEW: increase your physical activity    MONITORING & EVALUATION Dietary intake, weekly physical activity  Next Steps  Patient is to call or email with any future  questions or concerns

## 2022-11-24 ENCOUNTER — Other Ambulatory Visit (HOSPITAL_BASED_OUTPATIENT_CLINIC_OR_DEPARTMENT_OTHER): Payer: 59

## 2022-11-24 DIAGNOSIS — N979 Female infertility, unspecified: Secondary | ICD-10-CM

## 2022-11-25 LAB — PROGESTERONE: Progesterone: 18.2 ng/mL

## 2022-12-02 ENCOUNTER — Other Ambulatory Visit (HOSPITAL_BASED_OUTPATIENT_CLINIC_OR_DEPARTMENT_OTHER): Payer: Self-pay | Admitting: Obstetrics & Gynecology

## 2022-12-02 DIAGNOSIS — N979 Female infertility, unspecified: Secondary | ICD-10-CM

## 2022-12-02 MED ORDER — LETROZOLE 2.5 MG PO TABS
ORAL_TABLET | ORAL | 0 refills | Status: DC
Start: 2022-12-02 — End: 2023-02-09

## 2023-01-08 ENCOUNTER — Encounter: Payer: 59 | Admitting: Family Medicine

## 2023-01-10 ENCOUNTER — Ambulatory Visit (HOSPITAL_BASED_OUTPATIENT_CLINIC_OR_DEPARTMENT_OTHER): Payer: 59

## 2023-01-10 VITALS — BP 116/79 | HR 88 | Ht 62.0 in | Wt 206.4 lb

## 2023-01-10 DIAGNOSIS — N926 Irregular menstruation, unspecified: Secondary | ICD-10-CM

## 2023-01-10 DIAGNOSIS — Z3201 Encounter for pregnancy test, result positive: Secondary | ICD-10-CM

## 2023-01-10 LAB — POCT URINE PREGNANCY: Preg Test, Ur: POSITIVE — AB

## 2023-01-10 NOTE — Progress Notes (Signed)
Pt presents to office for confirmation of pregnancy. Test positive.

## 2023-01-19 ENCOUNTER — Emergency Department: Payer: 59

## 2023-01-19 ENCOUNTER — Emergency Department
Admission: EM | Admit: 2023-01-19 | Discharge: 2023-01-19 | Disposition: A | Discharge status: Home / Self Care | Payer: 59 | Attending: Emergency Medicine | Admitting: Emergency Medicine

## 2023-01-19 ENCOUNTER — Other Ambulatory Visit: Payer: Self-pay

## 2023-01-19 DIAGNOSIS — O209 Hemorrhage in early pregnancy, unspecified: Secondary | ICD-10-CM | POA: Diagnosis not present

## 2023-01-19 DIAGNOSIS — J45909 Unspecified asthma, uncomplicated: Secondary | ICD-10-CM | POA: Diagnosis not present

## 2023-01-19 DIAGNOSIS — Z3A01 Less than 8 weeks gestation of pregnancy: Secondary | ICD-10-CM | POA: Diagnosis not present

## 2023-01-19 DIAGNOSIS — O30041 Twin pregnancy, dichorionic/diamniotic, first trimester: Secondary | ICD-10-CM | POA: Diagnosis not present

## 2023-01-19 DIAGNOSIS — O418X10 Other specified disorders of amniotic fluid and membranes, first trimester, not applicable or unspecified: Secondary | ICD-10-CM | POA: Diagnosis not present

## 2023-01-19 DIAGNOSIS — O4690 Antepartum hemorrhage, unspecified, unspecified trimester: Secondary | ICD-10-CM

## 2023-01-19 DIAGNOSIS — O30001 Twin pregnancy, unspecified number of placenta and unspecified number of amniotic sacs, first trimester: Secondary | ICD-10-CM

## 2023-01-19 DIAGNOSIS — O208 Other hemorrhage in early pregnancy: Secondary | ICD-10-CM

## 2023-01-19 DIAGNOSIS — Z3490 Encounter for supervision of normal pregnancy, unspecified, unspecified trimester: Secondary | ICD-10-CM

## 2023-01-19 DIAGNOSIS — O3481 Maternal care for other abnormalities of pelvic organs, first trimester: Secondary | ICD-10-CM | POA: Diagnosis not present

## 2023-01-19 LAB — CBC
HCT: 36.5 % (ref 36.0–46.0)
Hemoglobin: 11.8 g/dL — ABNORMAL LOW (ref 12.0–15.0)
MCH: 24.5 pg — ABNORMAL LOW (ref 26.0–34.0)
MCHC: 32.3 g/dL (ref 30.0–36.0)
MCV: 75.9 fL — ABNORMAL LOW (ref 80.0–100.0)
Platelets: 447 10*3/uL — ABNORMAL HIGH (ref 150–400)
RBC: 4.81 MIL/uL (ref 3.87–5.11)
RDW: 13.8 % (ref 11.5–15.5)
WBC: 8.4 10*3/uL (ref 4.0–10.5)
nRBC: 0 % (ref 0.0–0.2)

## 2023-01-19 LAB — BASIC METABOLIC PANEL
Anion gap: 11 (ref 5–15)
BUN: 12 mg/dL (ref 6–20)
CO2: 20 mmol/L — ABNORMAL LOW (ref 22–32)
Calcium: 8.9 mg/dL (ref 8.9–10.3)
Chloride: 102 mmol/L (ref 98–111)
Creatinine, Ser: 0.77 mg/dL (ref 0.44–1.00)
GFR, Estimated: >60 mL/min (ref >60)
Glucose, Bld: 91 mg/dL (ref 70–99)
Potassium: 3.7 mmol/L (ref 3.5–5.1)
Sodium: 133 mmol/L — ABNORMAL LOW (ref 135–145)

## 2023-01-19 LAB — HCG, QUANTITATIVE, PREGNANCY: hCG, Beta Chain, Quant, S: 57411 m[IU]/mL — ABNORMAL HIGH (ref <5)

## 2023-01-19 LAB — ANTIBODY SCREEN: Antibody Screen: NEGATIVE

## 2023-01-19 LAB — ABO/RH: ABO/RH(D): O NEG

## 2023-01-19 MED ORDER — RHO D IMMUNE GLOBULIN 1500 UNIT/2ML IJ SOSY
300.0000 ug | PREFILLED_SYRINGE | Freq: Once | INTRAMUSCULAR | Status: AC
  Administered 2023-01-19: 300 ug via INTRAMUSCULAR
  Filled 2023-01-19: qty 2

## 2023-01-19 NOTE — ED Notes (Signed)
Called x 2 for flex and triage stated they called this pt also without answer. Will attempt to call us to see if pt is there or if pt possibly left after she was triaged.

## 2023-01-19 NOTE — ED Triage Notes (Signed)
Pt comes with c/o vaginal bleeding that started this morning. Pt state she just found out she is preg not sure how far along. Pt has 1st appt Nov 1. Pt states little cramping and the spotting when she wiped.

## 2023-01-19 NOTE — Discharge Instructions (Signed)
Follow-up with your GYN doctor.  Please call for an appointment. Tell your GYN that you were seen in the emergency department, that you have a twin pregnancy, and have a subchorionic hemorrhage Pelvic rest, no heavy lifting, Return emergency department if increased abdominal pain, saturating 1 pad per hour.

## 2023-01-19 NOTE — ED Notes (Signed)
[place] was called, pt is not back there either. Told us to attempt to call for pt and if she's not in the waiting room, then she will be taken off as pt has seemingly left the waiting area as we have tried 5 times to call for pt and she is not answering/appearing

## 2023-01-19 NOTE — ED Provider Notes (Signed)
[facility] Provider Note    Event Date/Time   First MD Initiated Contact with Patient 01/19/23 1133     (approximate)   History   Vaginal Bleeding   HPI  Diamond Thompson is a 37 y.o. female with history of asthma, prediabetes who is currently G1, P0 presents emergency department with vaginal bleeding in pregnancy.  States she is unsure of how far along she just has an appointment on November 1.  She states she is having a little cramping and spotting when she wiped.  Is not soaking more than 1 pad per hour.  Denies any noted injury.  Unsure of her blood type.      Physical Exam   Triage Vital Signs: ED Triage Vitals  Encounter Vitals Group     BP 01/19/23 0901 125/86     Systolic BP Percentile --      Diastolic BP Percentile --      Pulse Rate 01/19/23 0901 80     Resp 01/19/23 0901 17     Temp 01/19/23 0901 98 F (36.7 C)     Temp src --      SpO2 01/19/23 0901 98 %     Weight 01/19/23 0900 206 lb (93.4 kg)     Height 01/19/23 0900 5' (1.524 m)     Head Circumference --      Peak Flow --      Pain Score 01/19/23 0859 1     Pain Loc --      Pain Education --      Exclude from Growth Chart --     Most recent vital signs: Vitals:   01/19/23 0901  BP: 125/86  Pulse: 80  Resp: 17  Temp: 98 F (36.7 C)  SpO2: 98%     General: Awake, no distress.   CV:  Good peripheral perfusion. regular rate and  rhythm Resp:  Normal effort. Lungs cta Abd:  No distention.  Minimally tender in the lower quads bilaterally Other:      ED Results / Procedures / Treatments   Labs (all labs ordered are listed, but only abnormal results are displayed) Labs Reviewed  CBC - Abnormal; Notable for the following components:      Result Value   Hemoglobin 11.8 (*)    MCV 75.9 (*)    MCH 24.5 (*)    Platelets 447 (*)    All other components within normal limits  HCG, QUANTITATIVE, PREGNANCY - Abnormal; Notable for the following components:    hCG, Beta Chain, Quant, S 57,411 (*)    All other components within normal limits  BASIC METABOLIC PANEL - Abnormal; Notable for the following components:   Sodium 133 (*)    CO2 20 (*)    All other components within normal limits  ABO/RH  ANTIBODY SCREEN  RHOGAM INJECTION     EKG     RADIOLOGY Ultrasound OB less than 14 weeks    PROCEDURES:   Procedures   MEDICATIONS ORDERED IN ED: Medications  rho (d) immune globulin (RHIG/RHOPHYLAC) injection 300 mcg (300 mcg Intramuscular Given 01/19/23 1437)     IMPRESSION / MDM / ASSESSMENT AND PLAN / ED COURSE  I reviewed the triage vital signs and the nursing notes.                              Differential diagnosis includes, but is not limited to, vaginal  bleeding pregnancy, ectopic, subchorionic hemorrhage, threatened miscarriage, miscarriage  Patient's presentation is most consistent with acute presentation with potential threat to life or bodily function.   With concerns of ectopic as she is in early pregnancy we will go ahead with ultrasound OB less than 14 weeks  beta hCG is 57,411, ABO negative on her blood type so we will give RhoGAM here in the ED  Remainder of her labs are reassuring  Ultrasound OB less than 14 weeks  I did independently review and interpret  the ultrasound, there are 2 fetuses with positive fetal heart tone, awaiting radiologist read   Radiologist comments subchorionic hemorrhage that is moderate.  I did explain all findings to the patient.  She is to follow-up with her GYN Dr. Hyacinth Meeker.  She is to let them know she is O- and we started the first dose of RhoGAM here in the emergency department.  Also notify them that she has a twin pregnancy as they may need to see her earlier rather than later.  Take over-the-counter prenatal pills.  Return emergency department if saturating 1 pad per hour or increased abdominal pain.  Patient is in agreement treatment plan.  She was discharged in stable  condition.     FINAL CLINICAL IMPRESSION(S) / ED DIAGNOSES   Final diagnoses:  Subchorionic hemorrhage in first trimester  Intrauterine pregnancy  Vaginal bleeding in pregnancy     Rx / DC Orders   ED Discharge Orders     None        Note:  This document was prepared using Dragon voice recognition software and may include unintentional dictation errors.    Faythe Ghee, PA-C 01/19/23 1559    Corena Herter, MD 01/19/23 2026

## 2023-01-20 LAB — RHOGAM INJECTION: Unit division: 0

## 2023-01-22 ENCOUNTER — Ambulatory Visit (INDEPENDENT_AMBULATORY_CARE_PROVIDER_SITE_OTHER): Payer: 59 | Admitting: Family Medicine

## 2023-01-22 ENCOUNTER — Encounter: Payer: Self-pay | Admitting: Family Medicine

## 2023-01-22 VITALS — BP 119/77 | HR 90 | Temp 98.2°F | Ht 61.0 in | Wt 197.0 lb

## 2023-01-22 DIAGNOSIS — E66812 Obesity, class 2: Secondary | ICD-10-CM

## 2023-01-22 DIAGNOSIS — O30001 Twin pregnancy, unspecified number of placenta and unspecified number of amniotic sacs, first trimester: Secondary | ICD-10-CM | POA: Insufficient documentation

## 2023-01-22 DIAGNOSIS — I10 Essential (primary) hypertension: Secondary | ICD-10-CM

## 2023-01-22 DIAGNOSIS — Z6837 Body mass index (BMI) 37.0-37.9, adult: Secondary | ICD-10-CM | POA: Diagnosis not present

## 2023-01-22 DIAGNOSIS — R7303 Prediabetes: Secondary | ICD-10-CM

## 2023-01-22 NOTE — Assessment & Plan Note (Signed)
BP well controlled on Amlodopine- olmesartan 10/40 mg daily She recently found out that she was pregnant with twins, ~[redacted] weeks gestation and is set up to see an OB gyn  Recommend discussing alternative treatments for HTN with OB

## 2023-01-22 NOTE — Progress Notes (Signed)
Office: [phone]  /  Fax: [phone]   Initial Visit  Francie Massing was seen in clinic today to evaluate for obesity. She is interested in losing weight to improve overall health and reduce the risk of weight related complications. She presents today to review program treatment options, initial physical assessment, and evaluation.     She was referred by: PCP  When asked what else they would like to accomplish? She states: Improve energy levels and physical activity, Improve existing medical conditions, and Improve quality of life  Weight history:  she recently found out that she is pregnant with twins, about 8 weeks.  She would like to 180 lb after this pregnancy.  Her previous max weight 210 lb.  She works in Teacher, early years/pre at [employer]  When asked how has your weight affected you? She states: Contributed to medical problems and Having fatigue  Some associated conditions: Hypertension, Prediabetes, and PCOS  Contributing factors: Stress and Reduced physical activity  Weight promoting medications identified: None  Current nutrition plan: None  Current level of physical activity: NEAT  Current or previous pharmacotherapy: None  on metformin now for PCOS  Response to medication:  no change with metformin   Past medical history includes:   Past Medical History:  Diagnosis Date   Allergy    Asthma    Cholelithiasis 05/28/2022   Fibroid    Infrequent menses 2019   Pre-diabetes    Primary hypertension 12/05/2021   STD (sexually transmitted disease)    age 37?   Viral labyrinthitis of both ears 08/15/2022     Objective:   BP 119/77   Pulse 90   Temp 98.2 F (36.8 C)   Ht 5\' 1"  (1.549 m)   Wt 197 lb (89.4 kg)   LMP 12/04/2022 (Approximate)   SpO2 100%   BMI 37.22 kg/m  She was weighed on the bioimpedance scale: Body mass index is 37.22 kg/m.  Peak Weight:210 , Body Fat%:40.0, Visceral Fat Rating:9, Weight trend over the last 12 months:  Unchanged  General:  Alert, oriented and cooperative. Patient is in no acute distress.  Respiratory: Normal respiratory effort, no problems with respiration noted   Gait: able to ambulate independently  Mental Status: Normal mood and affect. Normal behavior. Normal judgment and thought content.   DIAGNOSTIC DATA REVIEWED:  BMET    Component Value Date/Time   NA 133 (L) 01/19/2023 0904   K 3.7 01/19/2023 0904   CL 102 01/19/2023 0904   CO2 20 (L) 01/19/2023 0904   GLUCOSE 91 01/19/2023 0904   BUN 12 01/19/2023 0904   CREATININE 0.77 01/19/2023 0904   CALCIUM 8.9 01/19/2023 0904   GFRNONAA >60 01/19/2023 0904   Lab Results  Component Value Date   HGBA1C 6.2 05/23/2022   HGBA1C 6.3 01/10/2022   Lab Results  Component Value Date   INSULIN 31.5 (H) 08/25/2022   CBC    Component Value Date/Time   WBC 8.4 01/19/2023 0904   RBC 4.81 01/19/2023 0904   HGB 11.8 (L) 01/19/2023 0904   HGB 11.6 10/31/2018 1016   HCT 36.5 01/19/2023 0904   HCT 37.5 10/31/2018 1016   PLT 447 (H) 01/19/2023 0904   PLT 460 (H) 10/31/2018 1016   MCV 75.9 (L) 01/19/2023 0904   MCV 80 10/31/2018 1016   MCH 24.5 (L) 01/19/2023 0904   MCHC 32.3 01/19/2023 0904   RDW 13.8 01/19/2023 0904   RDW 15.0 10/31/2018 1016   Iron/TIBC/Ferritin/ %Sat No results found  for: "IRON", "TIBC", "FERRITIN", "IRONPCTSAT" Lipid Panel     Component Value Date/Time   CHOL 162 05/23/2022 1418   TRIG 99.0 05/23/2022 1418   HDL 41.70 05/23/2022 1418   CHOLHDL 4 05/23/2022 1418   VLDL 19.8 05/23/2022 1418   LDLCALC 101 (H) 05/23/2022 1418   Hepatic Function Panel     Component Value Date/Time   PROT 7.4 06/05/2022 0924   ALBUMIN 4.0 06/05/2022 0924   AST 17 06/05/2022 0924   ALT 16 06/05/2022 0924   ALKPHOS 109 06/05/2022 0924   BILITOT 0.4 06/05/2022 0924      Component Value Date/Time   TSH 2.25 05/23/2022 1418     Assessment and Plan:   Primary hypertension Assessment & Plan: BP well controlled on  Amlodopine- olmesartan 10/40 mg daily She recently found out that she was pregnant with twins, ~[redacted] weeks gestation and is set up to see an OB gyn  Recommend discussing alternative treatments for HTN with OB   Class 2 severe obesity due to excess calories with serious comorbidity and body mass index (BMI) of 37.0 to 37.9 in adult [facility])  Prediabetes Assessment & Plan: Lab Results  Component Value Date   HGBA1C 6.2 05/23/2022   She has been in the prediabetic range and is currently on metformin for PCOS.  She is tolerating this well and would like to avoid a diagnosis of type II diabetes  We will plan to see her back after her pregnancy and will be updating labs It would be a good idea for her to see an RD during her pregnancy   Twin gestation in first trimester, unspecified multiple gestation type Assessment & Plan: Recent ED notes reviewed from 10/11. She is in a stable marriage with a good support system. This is her first pregnancy and she is happy. She feels well without N/V and is taking a PNV daily. Since she was here to start a plan for medically supervised weight management, we discussed postponing an active plan for weight loss until after her pregnancy.  Focus on small meals with lean protein, fiber and healthy fats Minimize high sugar foods and drinks, hydrate well with water and physically active Keep upcoming visit with OB and continue a PNV daily         Obesity Treatment / Action Plan:  Patient will work on garnering support from family and friends to begin weight loss journey. Will work on eliminating or reducing the presence of highly palatable, calorie dense foods in the home. Will complete provided nutritional and psychosocial assessment questionnaire before the next appointment. Will be scheduled for indirect calorimetry to determine resting energy expenditure in a fasting state.  This will allow [place] to create a reduced calorie, high-protein meal plan to  promote loss of fat mass while preserving muscle mass. Will think about ideas on how to incorporate physical activity into their daily routine. Will work on improving sleep hygiene and trying to obtain at least 7 hours of sleep. Was counseled on nutritional approaches to weight loss and benefits of reducing processed foods and consuming plant-based foods and high quality protein as part of nutritional weight management. Was counseled on pharmacotherapy and role as an adjunct in weight management.   Obesity Education Performed Today:  She was weighed on the bioimpedance scale and results were discussed and documented in the synopsis.  We discussed obesity as a disease and the importance of a more detailed evaluation of all the factors contributing to the disease.  We  discussed the importance of long term lifestyle changes which include nutrition, exercise and behavioral modifications as well as the importance of customizing this to her specific health and social needs.  We discussed the benefits of reaching a healthier weight to alleviate the symptoms of existing conditions and reduce the risks of the biomechanical, metabolic and psychological effects of obesity.  Francie Massing appears to be in the action stage of change and states they are ready to start intensive lifestyle modifications and behavioral modifications.  30 minutes was spent today on this visit including the above counseling, pre-visit chart review, and post-visit documentation.  Reviewed by clinician on day of visit: allergies, medications, problem list, medical history, surgical history, family history, social history, and previous encounter notes pertinent to obesity diagnosis.    Seymour Bars, D.O. DABFM, DABOM [facility] [phone] [place], [place] [address] [phone]

## 2023-01-22 NOTE — Assessment & Plan Note (Signed)
Lab Results  Component Value Date   HGBA1C 6.2 05/23/2022   She has been in the prediabetic range and is currently on metformin for PCOS.  She is tolerating this well and would like to avoid a diagnosis of type II diabetes  We will plan to see her back after her pregnancy and will be updating labs It would be a good idea for her to see an RD during her pregnancy

## 2023-01-22 NOTE — Assessment & Plan Note (Signed)
Recent ED notes reviewed from 10/11. She is in a stable marriage with a good support system. This is her first pregnancy and she is happy. She feels well without N/V and is taking a PNV daily. Since she was here to start a plan for medically supervised weight management, we discussed postponing an active plan for weight loss until after her pregnancy.  Focus on small meals with lean protein, fiber and healthy fats Minimize high sugar foods and drinks, hydrate well with water and physically active Keep upcoming visit with OB and continue a PNV daily

## 2023-02-05 ENCOUNTER — Telehealth (HOSPITAL_BASED_OUTPATIENT_CLINIC_OR_DEPARTMENT_OTHER): Payer: Self-pay | Admitting: *Deleted

## 2023-02-05 NOTE — Telephone Encounter (Signed)
Called pt in response to message request. Pt reports one episode of spotting yesterday. None since then or today. She denies pain or other symptoms. Offered pt appt today for evaluation. Pt would like to keep scheduled appt on 11/1. She just wanted to make [place] aware of the episode. Pt does have chorionic hemorrhage noted on ultrasound.

## 2023-02-09 ENCOUNTER — Other Ambulatory Visit: Payer: Self-pay | Admitting: Obstetrics & Gynecology

## 2023-02-09 ENCOUNTER — Ambulatory Visit (HOSPITAL_BASED_OUTPATIENT_CLINIC_OR_DEPARTMENT_OTHER)
Admission: RE | Admit: 2023-02-09 | Discharge: 2023-02-09 | Disposition: A | Discharge status: Home / Self Care | Payer: 59 | Source: Ambulatory Visit | Attending: Obstetrics & Gynecology | Admitting: Obstetrics & Gynecology

## 2023-02-09 ENCOUNTER — Encounter (HOSPITAL_BASED_OUTPATIENT_CLINIC_OR_DEPARTMENT_OTHER): Payer: Self-pay | Admitting: *Deleted

## 2023-02-09 ENCOUNTER — Other Ambulatory Visit (HOSPITAL_COMMUNITY): Payer: Self-pay

## 2023-02-09 ENCOUNTER — Other Ambulatory Visit (HOSPITAL_BASED_OUTPATIENT_CLINIC_OR_DEPARTMENT_OTHER): Payer: Self-pay | Admitting: Obstetrics & Gynecology

## 2023-02-09 ENCOUNTER — Encounter (HOSPITAL_BASED_OUTPATIENT_CLINIC_OR_DEPARTMENT_OTHER): Payer: Self-pay | Admitting: Obstetrics & Gynecology

## 2023-02-09 ENCOUNTER — Other Ambulatory Visit (HOSPITAL_COMMUNITY)
Admission: RE | Admit: 2023-02-09 | Discharge: 2023-02-09 | Disposition: A | Discharge status: Home / Self Care | Payer: 59 | Source: Ambulatory Visit | Attending: Obstetrics & Gynecology | Admitting: Obstetrics & Gynecology

## 2023-02-09 ENCOUNTER — Other Ambulatory Visit: Payer: Self-pay

## 2023-02-09 ENCOUNTER — Other Ambulatory Visit (INDEPENDENT_AMBULATORY_CARE_PROVIDER_SITE_OTHER): Payer: 59

## 2023-02-09 ENCOUNTER — Ambulatory Visit (HOSPITAL_BASED_OUTPATIENT_CLINIC_OR_DEPARTMENT_OTHER): Payer: 59 | Admitting: Obstetrics & Gynecology

## 2023-02-09 ENCOUNTER — Ambulatory Visit (HOSPITAL_BASED_OUTPATIENT_CLINIC_OR_DEPARTMENT_OTHER): Payer: 59 | Admitting: *Deleted

## 2023-02-09 VITALS — BP 124/88 | HR 83 | Wt 203.6 lb

## 2023-02-09 DIAGNOSIS — D251 Intramural leiomyoma of uterus: Secondary | ICD-10-CM | POA: Diagnosis not present

## 2023-02-09 DIAGNOSIS — E282 Polycystic ovarian syndrome: Secondary | ICD-10-CM

## 2023-02-09 DIAGNOSIS — Z124 Encounter for screening for malignant neoplasm of cervix: Secondary | ICD-10-CM | POA: Diagnosis not present

## 2023-02-09 DIAGNOSIS — O3680X1 Pregnancy with inconclusive fetal viability, fetus 1: Secondary | ICD-10-CM

## 2023-02-09 DIAGNOSIS — R7303 Prediabetes: Secondary | ICD-10-CM | POA: Diagnosis not present

## 2023-02-09 DIAGNOSIS — O3680X2 Pregnancy with inconclusive fetal viability, fetus 2: Secondary | ICD-10-CM | POA: Insufficient documentation

## 2023-02-09 DIAGNOSIS — O209 Hemorrhage in early pregnancy, unspecified: Secondary | ICD-10-CM | POA: Insufficient documentation

## 2023-02-09 DIAGNOSIS — O0991 Supervision of high risk pregnancy, unspecified, first trimester: Secondary | ICD-10-CM | POA: Insufficient documentation

## 2023-02-09 DIAGNOSIS — O30041 Twin pregnancy, dichorionic/diamniotic, first trimester: Secondary | ICD-10-CM | POA: Diagnosis not present

## 2023-02-09 DIAGNOSIS — D252 Subserosal leiomyoma of uterus: Secondary | ICD-10-CM | POA: Diagnosis not present

## 2023-02-09 DIAGNOSIS — I10 Essential (primary) hypertension: Secondary | ICD-10-CM | POA: Diagnosis not present

## 2023-02-09 DIAGNOSIS — Z3A09 9 weeks gestation of pregnancy: Secondary | ICD-10-CM | POA: Diagnosis not present

## 2023-02-09 DIAGNOSIS — Z3A00 Weeks of gestation of pregnancy not specified: Secondary | ICD-10-CM | POA: Diagnosis not present

## 2023-02-09 DIAGNOSIS — O021 Missed abortion: Secondary | ICD-10-CM | POA: Diagnosis not present

## 2023-02-09 MED ORDER — NIFEDIPINE ER OSMOTIC RELEASE 60 MG PO TB24
60.0000 mg | ORAL_TABLET | Freq: Every day | ORAL | 2 refills | Status: DC
Start: 2023-02-09 — End: 2023-07-02
  Filled 2023-02-09 – 2023-03-07 (×2): qty 30, 30d supply, fill #0
  Filled 2023-04-20: qty 30, 30d supply, fill #1
  Filled 2023-05-27: qty 30, 30d supply, fill #2

## 2023-02-09 NOTE — Progress Notes (Signed)
GYNECOLOGY  VISIT  CC:   bleeding first trimester  HPI: 37 y.o. G48P0000 Married [place] American female here for new ob appointment.  Pt has continued to have spotting since diagnosed with moderate subchorionic hemorrhage.  Not having any cramping.  Not having reflux.  Does need pap smear today as well.    H/o prediabetes and on metformin.  Is taking Azor and this needs to change to Procardia XL daily.  Dosing reviewed and feel the procradia dosing should be around 60mg .  New rx will be written.   Past Medical History:  Diagnosis Date   Abnormal uterine bleeding (AUB) 01/13/2022   Allergy    Asthma    Cholelithiasis 05/28/2022   Fibroid    Infrequent menses 2019   Neck mass 05/28/2022   Pre-diabetes    Primary hypertension 12/05/2021   STD (sexually transmitted disease)    age 37?   Viral labyrinthitis of both ears 08/15/2022    MEDS:   Current Outpatient Medications on File Prior to Visit  Medication Sig Dispense Refill   metFORMIN (GLUCOPHAGE) 500 MG tablet TAKE 1 TABLET IN AM FOR 7 DAYS AND THEN INCREASED TO TWICE DAILY WITH MEALS. 180 tablet 1   omeprazole (PRILOSEC) 20 MG capsule Take 1 capsule (20 mg total) by mouth daily. (Patient not taking: Reported on 02/09/2023) 90 capsule 3   Prenatal Vit-Fe Fumarate-FA (PRENATAL PO) Take 1 tablet by mouth daily.     No current facility-administered medications on file prior to visit.    ALLERGIES: Fruit extracts, Kiwi extract, and Latex  SH:  married, non smoker  Review of Systems  Constitutional: Negative.   Genitourinary:        Vaginal spotting    PHYSICAL EXAMINATION:    BP 124/88   Pulse 83   Wt 203 lb 9.6 oz (92.4 kg)   LMP 12/01/2022   BMI 38.47 kg/m     General appearance: alert, cooperative and appears stated age CV:  Regular rate and rhythm Lungs:  clear to auscultation, no wheezes, rales or rhonchi, symmetric air entry Abdomen: soft, non-tender; bowel sounds normal; no masses,  no  organomegaly Lymph:  no inguinal LAD noted  Pelvic: External genitalia:  no lesions              Urethra:  normal appearing urethra with no masses, tenderness or lesions              Bartholins and Skenes: normal                 Vagina: normal mucosa without prolapse or lesions              Cervix: no lesions and pap obtained with spatula only              Bimanual Exam:  Uterus:  enlarged, about 12 weeks size              Adnexa: no mass, fullness, tenderness   Ultrasound performed today.  Both fetuses are measuring about a week behind and there is no FCA noted in either fetus.  RN, Raechel Ache, was present for ultrasound as well and confirmed findings.    Chaperone, Raechel Ache, RN, was present for exam.  Assessment/Plan: 1. Vaginal bleeding affecting early pregnancy - ultrasound findings c/w missed ab.  Will have pt go for imaging with radiology for confirmation. - expectant management, misoprostol and D&E discussed.  Will await results of ultrasound to then discuss with pt  2. Dichorionic diamniotic twin pregnancy in first trimester  3. Cervical cancer screening - Cytology - PAP( [place])  4. Prediabetes - continue metformin  5. Primary hypertension - rx was changed to procardia but depending on results - NIFEdipine (PROCARDIA XL) 60 MG 24 hr tablet; Take 1 tablet (60 mg total) by mouth daily.  Dispense: 30 tablet; Refill: 2  6. PCOS (polycystic ovarian syndrome)  7. Intramural and subserous leiomyoma of uterus

## 2023-02-09 NOTE — Progress Notes (Unsigned)
New OB Intake  I explained I am completing New OB Intake today. We discussed EDD of 09/07/2023, by Last Menstrual Period. Pt is G1P0000. I reviewed her allergies, medications and Medical/Surgical/OB history.    Patient Active Problem List   Diagnosis Date Noted   Twin gestation in first trimester 01/22/2023   PCOS (polycystic ovarian syndrome) 09/29/2022   Gastroesophageal reflux disease 09/29/2022   Prediabetes 08/25/2022   Primary hypertension 12/05/2021   Intramural and subserous leiomyoma of uterus 04/27/2019    Concerns addressed today  Delivery Plans Plans to deliver at [facility]. Discussed the nature of our practice with multiple providers including residents and students. Due to the size of the practice, the delivering provider may not be the same as those providing prenatal care.   Patient is not interested in water birth. Offered upcoming OB visit with CNM to discuss further.  MyChart/Babyscripts MyChart access verified. I explained pt will have some visits in office and some virtually. Babyscripts instructions given and order placed. Patient verifies receipt of registration text/e-mail. Account successfully created and app downloaded.  Blood Pressure Cuff/Weight Scale Patient has private insurance; instructed to purchase blood pressure cuff and bring to first prenatal appt. Explained after first prenatal appt pt will check weekly and document in Babyscripts. Patient does have weight scale.   Is patient a CenteringPregnancy candidate?  Declined Declined due to Group setting  Is patient a Mom+Baby Combined Care candidate?  Declined   If accepted, confirm patient does not intend to move from the area for at least 12 months, then notify Mom+Baby staff  Interested in [place]? If yes, send referral and doula dot phrase.   Is patient a candidate for Babyscripts Optimization? No - High risk; twin pregnancy  First visit review I reviewed new OB appt with patient. Explained pt  will be seen by Dr. Hyacinth Meeker at first visit. Discussed Avelina Laine genetic screening with patient. Pt desires both Panorama and Horizon, does not want to know the gender.  Routine prenatal labs  needed.     Last Pap Diagnosis  Date Value Ref Range Status  10/21/2018   Final   NEGATIVE FOR INTRAEPITHELIAL LESIONS OR MALIGNANCY.    Harrie Jeans, RN 02/09/2023  10:03 AM

## 2023-02-14 LAB — CYTOLOGY - PAP
Chlamydia: NEGATIVE
Comment: NEGATIVE
Comment: NEGATIVE
Comment: NORMAL
Diagnosis: NEGATIVE
High risk HPV: NEGATIVE
Neisseria Gonorrhea: NEGATIVE

## 2023-02-19 ENCOUNTER — Other Ambulatory Visit (HOSPITAL_COMMUNITY): Payer: Self-pay

## 2023-02-20 ENCOUNTER — Telehealth (HOSPITAL_BASED_OUTPATIENT_CLINIC_OR_DEPARTMENT_OTHER): Payer: Self-pay | Admitting: Obstetrics & Gynecology

## 2023-02-20 NOTE — Telephone Encounter (Signed)
Patient called and stated she is returning Dr.Miller 's call.

## 2023-02-20 NOTE — Telephone Encounter (Signed)
LMOVM for pt to call office 

## 2023-02-22 ENCOUNTER — Other Ambulatory Visit (HOSPITAL_BASED_OUTPATIENT_CLINIC_OR_DEPARTMENT_OTHER): Payer: Self-pay | Admitting: Obstetrics & Gynecology

## 2023-02-22 ENCOUNTER — Telehealth (HOSPITAL_BASED_OUTPATIENT_CLINIC_OR_DEPARTMENT_OTHER): Payer: Self-pay | Admitting: *Deleted

## 2023-02-22 DIAGNOSIS — O30041 Twin pregnancy, dichorionic/diamniotic, first trimester: Secondary | ICD-10-CM

## 2023-02-22 DIAGNOSIS — O021 Missed abortion: Secondary | ICD-10-CM

## 2023-02-22 NOTE — Telephone Encounter (Signed)
Patient stated she is returning Selena Batten the nurse call .

## 2023-02-22 NOTE — Telephone Encounter (Signed)
Pt reports that she has not had any bleeding. Would like to proceed with scheduling D&C for missed AB. Advised that I would make provider aware and someone would reach out to her to get procedure scheduled.

## 2023-02-26 ENCOUNTER — Encounter (HOSPITAL_BASED_OUTPATIENT_CLINIC_OR_DEPARTMENT_OTHER): Payer: Self-pay

## 2023-02-26 ENCOUNTER — Other Ambulatory Visit: Payer: Self-pay | Admitting: Obstetrics and Gynecology

## 2023-02-26 DIAGNOSIS — Z01812 Encounter for preprocedural laboratory examination: Secondary | ICD-10-CM

## 2023-02-26 NOTE — H&P (Signed)
Diamond Thompson is an 37 y.o. female G1P0 recently diagnosed with a missed abortion on 02/09/2023 here for surgical management. Patient reports some vaginal bleeding earlier in the month but nothing these past few days. She denies cramping pain. Patient with past medical history significant for [facility] and pre-diabetes. Patient is without any other complaints     Past Medical History:  Diagnosis Date   Abnormal uterine bleeding (AUB) 01/13/2022   Allergy    Asthma    Cholelithiasis 05/28/2022   Fibroid    Infrequent menses 2019   Neck mass 05/28/2022   Pre-diabetes    Primary hypertension 12/05/2021   STD (sexually transmitted disease)    age 38?   Viral labyrinthitis of both ears 08/15/2022    Past Surgical History:  Procedure Laterality Date   CHOLECYSTECTOMY N/A 09/06/2022   Procedure: LAPAROSCOPIC CHOLECYSTECTOMY;  Surgeon: Fritzi Mandes, MD;  Location: [facility] OR;  Service: General;  Laterality: N/A;   NO PAST SURGERIES      Family History  Problem Relation Age of Onset   Hypertension Mother    Hypertension Father    Hypertension Maternal Grandmother    Hypertension Paternal Grandmother    Diabetes Paternal Grandmother     Social History:  reports that she has never smoked. She has never been exposed to tobacco smoke. She has never used smokeless tobacco. She reports that she does not currently use alcohol. She reports that she does not currently use drugs.  Allergies:  Allergies  Allergen Reactions   Fruit Extracts Itching and Swelling    Raw fruit and vegetables.  Throat and tongue itching   Kiwi Extract Itching and Swelling    Raw fruits and veggies Throat and tongue itching   Latex Itching    No medications prior to admission.    Review of Systems See pertinent in HPI. All other systems reviewed and non contributory Last menstrual period 12/01/2022. Physical Exam GENERAL: Well-developed, well-nourished female in no acute distress.  LUNGS: Clear to  auscultation bilaterally.  HEART: Regular rate and rhythm. ABDOMEN: Soft, nontender, nondistended. No organomegaly. PELVIC: Deferred to OR EXTREMITIES: No cyanosis, clubbing, or edema, 2+ distal pulses.  No results found for this or any previous visit (from the past 24 hour(s)).  No results found. US OB Transvaginal  Result Date: 02/14/2023 CLINICAL DATA:  viability and dating, spotting, h/o subchorionic hemorrhage  EXAM: OBSTETRIC <14 WK [place] AND TRANSVAGINAL OB US  TECHNIQUE: Transvaginal ultrasound examinations was performed for complete evaluation of the gestation as well as the maternal uterus, adnexal regions, and pelvic cul-de-sac. COMPARISON:  None.  FINDINGS: Intrauterine gestational sac: Two sacs seen. Thick membrane noted c/w di/di pregnancy  TWIN 1  Yolk sac:  not seen  Embryo:  seen  Cardiac Activity: not seen  CRL:  2.33 mm   9 w 0 d                  [place] EDC: 09/14/2023  TWIN 2  Yolk sac:  seen  Embryo: seen  Cardiac Activity: not seen  CRL:  2.3 mm   9 w 1 d                  [place] EDC: 09/13/2023  Subchorionic hemorrhage:  Subchorionic hemorrhage noted anteriorly  Maternal uterus/adnexae: No adnexal mass. No free fluid in pelvis.   IMPRESSION: Twin intrauterine pregnancy. Fetal heart rates not seen.  Findings concerning missed ab.      US OB LESS THAN  14 WEEKS WITH OB TRANSVAGINAL  Result Date: 02/09/2023 CLINICAL DATA:  Viability. Missed spontaneous abortion twin pregnancy seen in office ultrasound. Vaginal spotting today. Estimated gestational age by LMP is 10 weeks 0 days. Quantitative beta hCG on 01/19/2023 was 57,411. EXAM: TWIN OBSTETRIC <14WK [place] AND TRANSVAGINAL OB US TECHNIQUE: Both transabdominal and transvaginal ultrasound examinations were performed for complete evaluation of the gestation as well as the maternal uterus, adnexal regions, and pelvic cul-de-sac. Transvaginal technique was performed to assess early pregnancy. COMPARISON:  01/19/2023 FINDINGS: Number of IUPs:  2  Chorionicity/Amnionicity:  Dichorionic-diamniotic (thick membrane) TWIN 1 Yolk sac:  Not Visualized. Embryo:  Visualized. Cardiac Activity: Not Visualized. CRL:  25.7 mm   9 w 2 d                  [place] EDC: 09/12/2023 TWIN 2 Yolk sac:  Visualized. Embryo:  Visualized. Cardiac Activity: Not Visualized. CRL:  22.8 mm   9 w 0 d                  [place] EDC: 09/14/2023 Subchorionic hemorrhage: Small subchorionic hemorrhage is identified. Maternal uterus/adnexae: Trace free fluid. Heterogeneous appearance of the myometrium with focal fibroids demonstrated, left measuring 2.8 and right 2.8 cm diameter, respectively. Both ovaries are visualized and appear normal. Small cyst in the left ovary is probably physiologic. IMPRESSION: Twin intrauterine pregnancy. Two fetal poles are demonstrated with estimated gestational age by crown-rump length of twin 1, 9 weeks 2 days and twin 2, 9 weeks 0 days. This is slightly delayed compared with the expected clinical dates. No fetal cardiac activity is demonstrated in either twin. Based on absence of fetal cardiac activity with crown-rump length of greater than 7 mm, the appearance meets criteria for definitely nonviable pregnancy. Electronically Signed   By: Burman Nieves M.D.   On: 02/09/2023 20:31   US OB Comp AddL Gest Less 14 Wks  Result Date: 02/09/2023 CLINICAL DATA:  Viability. Missed spontaneous abortion twin pregnancy seen in office ultrasound. Vaginal spotting today. Estimated gestational age by LMP is 10 weeks 0 days. Quantitative beta hCG on 01/19/2023 was 57,411. EXAM: TWIN OBSTETRIC <14WK [place] AND TRANSVAGINAL OB US TECHNIQUE: Both transabdominal and transvaginal ultrasound examinations were performed for complete evaluation of the gestation as well as the maternal uterus, adnexal regions, and pelvic cul-de-sac. Transvaginal technique was performed to assess early pregnancy. COMPARISON:  01/19/2023 FINDINGS: Number of IUPs:  2 Chorionicity/Amnionicity:   Dichorionic-diamniotic (thick membrane) TWIN 1 Yolk sac:  Not Visualized. Embryo:  Visualized. Cardiac Activity: Not Visualized. CRL:  25.7 mm   9 w 2 d                  [place] EDC: 09/12/2023 TWIN 2 Yolk sac:  Visualized. Embryo:  Visualized. Cardiac Activity: Not Visualized. CRL:  22.8 mm   9 w 0 d                  [place] EDC: 09/14/2023 Subchorionic hemorrhage: Small subchorionic hemorrhage is identified. Maternal uterus/adnexae: Trace free fluid. Heterogeneous appearance of the myometrium with focal fibroids demonstrated, left measuring 2.8 and right 2.8 cm diameter, respectively. Both ovaries are visualized and appear normal. Small cyst in the left ovary is probably physiologic. IMPRESSION: Twin intrauterine pregnancy. Two fetal poles are demonstrated with estimated gestational age by crown-rump length of twin 1, 9 weeks 2 days and twin 2, 9 weeks 0 days. This is slightly delayed compared with the expected clinical dates. No fetal cardiac  activity is demonstrated in either twin. Based on absence of fetal cardiac activity with crown-rump length of greater than 7 mm, the appearance meets criteria for definitely nonviable pregnancy. Electronically Signed   By: Burman Nieves M.D.   On: 02/09/2023 20:31    Assessment/Plan: 37 yo G1P0 with 9wk missed abortion of twin pregnancy - Patient previously counseled and opted for surgical management with D&E. Risks, benefits and alternatives were explained including but not limited to risks of bleeding, infection, uterine perforation and damage to adjacent organs. Patient verbalized understanding and all questions were answered.  Andora Krull 02/26/2023, 5:26 PM

## 2023-02-27 ENCOUNTER — Encounter (HOSPITAL_COMMUNITY): Payer: Self-pay | Admitting: Obstetrics and Gynecology

## 2023-02-27 ENCOUNTER — Other Ambulatory Visit: Payer: Self-pay | Admitting: Obstetrics and Gynecology

## 2023-02-27 ENCOUNTER — Other Ambulatory Visit: Payer: Self-pay

## 2023-02-27 DIAGNOSIS — Z01812 Encounter for preprocedural laboratory examination: Secondary | ICD-10-CM

## 2023-02-27 NOTE — Progress Notes (Signed)
SDW CALL  Patient was given pre-op instructions over the phone. The opportunity was given for the patient to ask questions. No further questions asked. Patient verbalized understanding of instructions given.   PCP - Griffith Citron Cardiologist - denies  PPM/ICD - denies Device Orders -  Rep Notified -   Chest x-ray - na EKG - 08/30/22 Stress Test - denies ECHO - denies Cardiac Cath - denies  Sleep Study - denies.  CPAP -   Fasting Blood Sugar - pt does not check blood sugar.  Checks Blood Sugar _____ times a day  Blood Thinner Instructions:na Aspirin Instructions:na  ERAS Protcol - clears until 1130 PRE-SURGERY Ensure or G2- no  COVID TEST- na   Anesthesia review: no  Patient denies shortness of breath, fever, cough and chest pain over the phone call    Surgical Instructions    Your procedure is scheduled on November 20  Report to [facility] Main Entrance "A" at 12 P.M., then check in with the Admitting office.  Call this number if you have problems the morning of surgery:  [phone]    Remember:  Do not eat after midnight the night before your surgery  You may drink clear liquids until 1130am the morning of your surgery.   Clear liquids allowed are: Water, Non-Citrus Juices (without pulp), Carbonated Beverages, Clear Tea, Black Coffee ONLY (NO MILK, CREAM OR POWDERED CREAMER of any kind), and Gatorade   Take these medicines the morning of surgery with A SIP OF WATER: Omeprazole,Procardia XL  DO NOT take Metformin(Glucophage) the day of surgery.  As of today, STOP taking any Aspirin (unless otherwise instructed by your surgeon) Aleve, Naproxen, Ibuprofen, Motrin, Advil, Goody's, BC's, all herbal medications, fish oil, and all vitamins.  [place] is not responsible for any belongings or valuables. .   Do NOT Smoke (Tobacco/Vaping)  24 hours prior to your procedure  If you use a CPAP at night, you may bring your mask for your overnight stay.    Contacts, glasses, hearing aids, dentures or partials may not be worn into surgery, please bring cases for these belongings   Patients discharged the day of surgery will not be allowed to drive home, and someone needs to stay with them for 24 hours.    Special instructions:    Oral Hygiene is also important to reduce your risk of infection.  Remember - BRUSH YOUR TEETH THE MORNING OF SURGERY WITH YOUR REGULAR TOOTHPASTE   Day of Surgery:  Take a shower the day of or night before with antibacterial soap. Wear Clean/Comfortable clothing the morning of surgery Do not apply any deodorants/lotions.   Do not wear jewelry or makeup Do not wear lotions, powders, perfumes/colognes, or deodorant. Do not shave 48 hours prior to surgery.  Men may shave face and neck. Do not bring valuables to the hospital. Do not wear nail polish, gel polish, artificial nails, or any other type of covering on natural nails (fingers and toes) If you have artificial nails or gel coating that need to be removed by a nail salon, please have this removed prior to surgery. Artificial nails or gel coating may interfere with anesthesia's ability to adequately monitor your vital signs. Remember to brush your teeth WITH YOUR REGULAR TOOTHPASTE.

## 2023-02-28 ENCOUNTER — Encounter (HOSPITAL_COMMUNITY): Payer: Self-pay | Admitting: Obstetrics and Gynecology

## 2023-02-28 ENCOUNTER — Other Ambulatory Visit: Payer: Self-pay

## 2023-02-28 ENCOUNTER — Encounter (HOSPITAL_COMMUNITY)
Admission: RE | Disposition: A | Discharge status: Home / Self Care | Payer: Self-pay | Source: Home / Self Care | Attending: Obstetrics and Gynecology

## 2023-02-28 ENCOUNTER — Other Ambulatory Visit (HOSPITAL_COMMUNITY): Payer: Self-pay

## 2023-02-28 ENCOUNTER — Other Ambulatory Visit (HOSPITAL_BASED_OUTPATIENT_CLINIC_OR_DEPARTMENT_OTHER): Payer: Self-pay

## 2023-02-28 ENCOUNTER — Ambulatory Visit (HOSPITAL_COMMUNITY)
Admission: RE | Admit: 2023-02-28 | Discharge: 2023-02-28 | Disposition: A | Discharge status: Home / Self Care | Payer: 59 | Attending: Obstetrics and Gynecology | Admitting: Obstetrics and Gynecology

## 2023-02-28 ENCOUNTER — Ambulatory Visit (HOSPITAL_COMMUNITY): Payer: 59 | Admitting: Anesthesiology

## 2023-02-28 DIAGNOSIS — O021 Missed abortion: Secondary | ICD-10-CM

## 2023-02-28 DIAGNOSIS — K219 Gastro-esophageal reflux disease without esophagitis: Secondary | ICD-10-CM | POA: Insufficient documentation

## 2023-02-28 DIAGNOSIS — R7303 Prediabetes: Secondary | ICD-10-CM | POA: Diagnosis not present

## 2023-02-28 DIAGNOSIS — Z7984 Long term (current) use of oral hypoglycemic drugs: Secondary | ICD-10-CM | POA: Diagnosis not present

## 2023-02-28 DIAGNOSIS — J45909 Unspecified asthma, uncomplicated: Secondary | ICD-10-CM | POA: Insufficient documentation

## 2023-02-28 DIAGNOSIS — I10 Essential (primary) hypertension: Secondary | ICD-10-CM | POA: Insufficient documentation

## 2023-02-28 DIAGNOSIS — Z01812 Encounter for preprocedural laboratory examination: Secondary | ICD-10-CM

## 2023-02-28 DIAGNOSIS — Z3A09 9 weeks gestation of pregnancy: Secondary | ICD-10-CM | POA: Diagnosis not present

## 2023-02-28 DIAGNOSIS — Z3A00 Weeks of gestation of pregnancy not specified: Secondary | ICD-10-CM | POA: Diagnosis not present

## 2023-02-28 HISTORY — PX: DILATION AND EVACUATION: SHX1459

## 2023-02-28 LAB — CBC
HCT: 39.8 % (ref 36.0–46.0)
Hemoglobin: 12.6 g/dL (ref 12.0–15.0)
MCH: 24.8 pg — ABNORMAL LOW (ref 26.0–34.0)
MCHC: 31.7 g/dL (ref 30.0–36.0)
MCV: 78.3 fL — ABNORMAL LOW (ref 80.0–100.0)
Platelets: 381 10*3/uL (ref 150–400)
RBC: 5.08 MIL/uL (ref 3.87–5.11)
RDW: 14.5 % (ref 11.5–15.5)
WBC: 6.3 10*3/uL (ref 4.0–10.5)
nRBC: 0 % (ref 0.0–0.2)

## 2023-02-28 LAB — TYPE AND SCREEN
ABO/RH(D): O NEG
Antibody Screen: POSITIVE

## 2023-02-28 LAB — BASIC METABOLIC PANEL
Anion gap: 8 (ref 5–15)
BUN: 5 mg/dL — ABNORMAL LOW (ref 6–20)
CO2: 20 mmol/L — ABNORMAL LOW (ref 22–32)
Calcium: 8.7 mg/dL — ABNORMAL LOW (ref 8.9–10.3)
Chloride: 105 mmol/L (ref 98–111)
Creatinine, Ser: 0.82 mg/dL (ref 0.44–1.00)
GFR, Estimated: >60 mL/min (ref >60)
Glucose, Bld: 88 mg/dL (ref 70–99)
Potassium: 3 mmol/L — ABNORMAL LOW (ref 3.5–5.1)
Sodium: 133 mmol/L — ABNORMAL LOW (ref 135–145)

## 2023-02-28 SURGERY — DILATION AND EVACUATION, UTERUS
Anesthesia: General

## 2023-02-28 MED ORDER — OXYCODONE HCL 5 MG PO TABS
5.0000 mg | ORAL_TABLET | Freq: Once | ORAL | Status: AC | PRN
  Administered 2023-02-28: 5 mg via ORAL

## 2023-02-28 MED ORDER — FENTANYL CITRATE (PF) 250 MCG/5ML IJ SOLN
INTRAMUSCULAR | Status: DC | PRN
  Administered 2023-02-28: 50 ug via INTRAVENOUS
  Administered 2023-02-28: 100 ug via INTRAVENOUS
  Administered 2023-02-28 (×2): 50 ug via INTRAVENOUS

## 2023-02-28 MED ORDER — SCOPOLAMINE 1 MG/3DAYS TD PT72
1.0000 | MEDICATED_PATCH | Freq: Once | TRANSDERMAL | Status: DC
  Administered 2023-02-28: 1.5 mg via TRANSDERMAL
  Filled 2023-02-28: qty 1

## 2023-02-28 MED ORDER — DEXAMETHASONE SODIUM PHOSPHATE 10 MG/ML IJ SOLN
INTRAMUSCULAR | Status: DC | PRN
  Administered 2023-02-28: 10 mg via INTRAVENOUS

## 2023-02-28 MED ORDER — MISOPROSTOL 100 MCG PO TABS
ORAL_TABLET | ORAL | Status: DC | PRN
Start: 2023-02-28 — End: 2023-02-28
  Administered 2023-02-28: 1000 ug

## 2023-02-28 MED ORDER — DROPERIDOL 2.5 MG/ML IJ SOLN: 0.6250 mg | Freq: Once | INTRAMUSCULAR | Status: DC | PRN

## 2023-02-28 MED ORDER — POVIDONE-IODINE 10 % EX SWAB
2.0000 | Freq: Once | CUTANEOUS | Status: AC
  Administered 2023-02-28: 2 via TOPICAL

## 2023-02-28 MED ORDER — LIDOCAINE 2% (20 MG/ML) 5 ML SYRINGE
INTRAMUSCULAR | Status: AC
  Filled 2023-02-28: qty 5

## 2023-02-28 MED ORDER — MIDAZOLAM HCL 2 MG/2ML IJ SOLN
INTRAMUSCULAR | Status: AC
  Filled 2023-02-28: qty 2

## 2023-02-28 MED ORDER — PROPOFOL 10 MG/ML IV BOLUS
INTRAVENOUS | Status: AC
Start: 2023-02-28 — End: ?
  Filled 2023-02-28: qty 20

## 2023-02-28 MED ORDER — CHLOROPROCAINE HCL 1 % IJ SOLN
INTRAMUSCULAR | Status: DC | PRN
  Administered 2023-02-28: 10 mL

## 2023-02-28 MED ORDER — PROPOFOL 10 MG/ML IV BOLUS
INTRAVENOUS | Status: DC | PRN
  Administered 2023-02-28: 200 mg via INTRAVENOUS

## 2023-02-28 MED ORDER — DEXMEDETOMIDINE HCL IN NACL 80 MCG/20ML IV SOLN
INTRAVENOUS | Status: DC | PRN
  Administered 2023-02-28: 4 ug via INTRAVENOUS
  Administered 2023-02-28: 8 ug via INTRAVENOUS

## 2023-02-28 MED ORDER — DEXAMETHASONE SODIUM PHOSPHATE 10 MG/ML IJ SOLN
INTRAMUSCULAR | Status: AC
  Filled 2023-02-28: qty 1

## 2023-02-28 MED ORDER — IBUPROFEN 600 MG PO TABS
600.0000 mg | ORAL_TABLET | Freq: Four times a day (QID) | ORAL | 3 refills | Status: DC | PRN
Start: 2023-02-28 — End: 2023-06-20
  Filled 2023-02-28: qty 60, 15d supply, fill #0

## 2023-02-28 MED ORDER — CHLOROPROCAINE HCL 1 % IJ SOLN
INTRAMUSCULAR | Status: AC
  Filled 2023-02-28: qty 30

## 2023-02-28 MED ORDER — LIDOCAINE 2% (20 MG/ML) 5 ML SYRINGE
INTRAMUSCULAR | Status: DC | PRN
  Administered 2023-02-28: 100 mg via INTRAVENOUS

## 2023-02-28 MED ORDER — OXYCODONE HCL 5 MG/5ML PO SOLN: 5.0000 mg | Freq: Once | ORAL | Status: AC | PRN

## 2023-02-28 MED ORDER — ONDANSETRON HCL 4 MG/2ML IJ SOLN
INTRAMUSCULAR | Status: DC | PRN
  Administered 2023-02-28: 4 mg via INTRAVENOUS

## 2023-02-28 MED ORDER — FENTANYL CITRATE (PF) 100 MCG/2ML IJ SOLN: 25.0000 ug | INTRAMUSCULAR | Status: DC | PRN

## 2023-02-28 MED ORDER — MISOPROSTOL 200 MCG PO TABS
ORAL_TABLET | ORAL | Status: AC
  Filled 2023-02-28: qty 5

## 2023-02-28 MED ORDER — DOXYCYCLINE HYCLATE 100 MG IV SOLR
200.0000 mg | INTRAVENOUS | Status: AC
  Administered 2023-02-28: 200 mg via INTRAVENOUS
  Filled 2023-02-28: qty 200

## 2023-02-28 MED ORDER — FENTANYL CITRATE (PF) 250 MCG/5ML IJ SOLN
INTRAMUSCULAR | Status: AC
  Filled 2023-02-28: qty 5

## 2023-02-28 MED ORDER — CHLORHEXIDINE GLUCONATE 0.12 % MT SOLN
15.0000 mL | Freq: Once | OROMUCOSAL | Status: AC
  Administered 2023-02-28: 15 mL via OROMUCOSAL
  Filled 2023-02-28: qty 15

## 2023-02-28 MED ORDER — ORAL CARE MOUTH RINSE: 15.0000 mL | Freq: Once | OROMUCOSAL | Status: AC

## 2023-02-28 MED ORDER — OXYCODONE HCL 5 MG PO TABS
ORAL_TABLET | ORAL | Status: AC
  Filled 2023-02-28: qty 1

## 2023-02-28 MED ORDER — MIDAZOLAM HCL 2 MG/2ML IJ SOLN
INTRAMUSCULAR | Status: DC | PRN
  Administered 2023-02-28: 2 mg via INTRAVENOUS

## 2023-02-28 MED ORDER — ACETAMINOPHEN 500 MG PO TABS
1000.0000 mg | ORAL_TABLET | Freq: Once | ORAL | Status: AC
  Administered 2023-02-28: 1000 mg via ORAL
  Filled 2023-02-28: qty 2

## 2023-02-28 MED ORDER — LACTATED RINGERS IV SOLN: INTRAVENOUS | Status: DC

## 2023-02-28 MED ORDER — DOXYCYCLINE HYCLATE 100 MG IV SOLR: 200.0000 mg | INTRAVENOUS | Status: DC

## 2023-02-28 MED ORDER — OXYCODONE-ACETAMINOPHEN 5-325 MG PO TABS
1.0000 | ORAL_TABLET | ORAL | 0 refills | Status: DC | PRN
  Filled 2023-02-28: qty 10, 2d supply, fill #0

## 2023-02-28 MED ORDER — ONDANSETRON HCL 4 MG/2ML IJ SOLN
INTRAMUSCULAR | Status: AC
  Filled 2023-02-28: qty 2

## 2023-02-28 SURGICAL SUPPLY — 21 items
CATH ROBINSON RED A/P 16FR (CATHETERS) ×1 IMPLANT
FILTER UTR ASPR ASSEMBLY (MISCELLANEOUS) ×1 IMPLANT
GLOVE BIOGEL PI IND STRL 6.5 (GLOVE) ×1 IMPLANT
GLOVE BIOGEL PI IND STRL 7.0 (GLOVE) ×1 IMPLANT
GLOVE SURG SS PI 6.5 STRL IVOR (GLOVE) ×1 IMPLANT
GOWN STRL REUS W/ TWL LRG LVL3 (GOWN DISPOSABLE) ×2 IMPLANT
HOSE CONNECTING 18IN BERKELEY (TUBING) ×1 IMPLANT
KIT BERKELEY 1ST TRI 3/8 NO TR (MISCELLANEOUS) ×1 IMPLANT
KIT BERKELEY 1ST TRIMESTER 3/8 (MISCELLANEOUS) ×1 IMPLANT
NS IRRIG 1000ML POUR BTL (IV SOLUTION) ×1 IMPLANT
PACK VAGINAL MINOR WOMEN LF (CUSTOM PROCEDURE TRAY) ×1 IMPLANT
PAD OB MATERNITY 4.3X12.25 (PERSONAL CARE ITEMS) ×1 IMPLANT
SET BERKELEY SUCTION TUBING (SUCTIONS) ×1 IMPLANT
SPIKE FLUID TRANSFER (MISCELLANEOUS) ×1 IMPLANT
TOWEL GREEN STERILE FF (TOWEL DISPOSABLE) ×2 IMPLANT
UNDERPAD 30X36 HEAVY ABSORB (UNDERPADS AND DIAPERS) ×1 IMPLANT
VACURETTE 10 RIGID CVD (CANNULA) IMPLANT
VACURETTE 6 ASPIR F TIP BERK (CANNULA) IMPLANT
VACURETTE 7MM CVD STRL WRAP (CANNULA) IMPLANT
VACURETTE 8 RIGID CVD (CANNULA) IMPLANT
VACURETTE 9 RIGID CVD (CANNULA) IMPLANT

## 2023-02-28 NOTE — Progress Notes (Signed)
Dr. Stephannie Peters made aware of today's BMP result- Potassium 3.0. No new orders received.

## 2023-02-28 NOTE — Anesthesia Procedure Notes (Signed)
Procedure Name: LMA Insertion Date/Time: 02/28/2023 1:57 PM  Performed by: Kayleen Memos, CRNAPre-anesthesia Checklist: Patient identified, Emergency Drugs available, Suction available and Patient being monitored Patient Re-evaluated:Patient Re-evaluated prior to induction Oxygen Delivery Method: Circle System Utilized Preoxygenation: Pre-oxygenation with 100% oxygen Induction Type: IV induction Ventilation: Mask ventilation without difficulty LMA: LMA inserted LMA Size: 4.0 Number of attempts: 1 Placement Confirmation: positive ETCO2 Tube secured with: Tape Dental Injury: Teeth and Oropharynx as per pre-operative assessment

## 2023-02-28 NOTE — Anesthesia Preprocedure Evaluation (Addendum)
Anesthesia Evaluation  Patient identified by MRN, date of birth, ID band Patient awake    Reviewed: Allergy & Precautions, NPO status , Patient's Chart, lab work & pertinent test results  History of Anesthesia Complications Negative for: history of anesthetic complications  Airway Mallampati: II  TM Distance: >3 FB Neck ROM: Full    Dental  (+) Missing,    Pulmonary asthma    Pulmonary exam normal        Cardiovascular hypertension, Pt. on medications Normal cardiovascular exam     Neuro/Psych negative neurological ROS     GI/Hepatic Neg liver ROS,GERD  Medicated,,  Endo/Other    Renal/GU negative Renal ROS     Musculoskeletal negative musculoskeletal ROS (+)    Abdominal   Peds  Hematology negative hematology ROS (+)   Anesthesia Other Findings Day of surgery medications reviewed with patient.  Reproductive/Obstetrics                              Anesthesia Physical Anesthesia Plan  ASA: 2  Anesthesia Plan: General   Post-op Pain Management: Tylenol PO (pre-op)* and Toradol IV (intra-op)*   Induction: Intravenous  PONV Risk Score and Plan: 3 and Midazolam, Treatment may vary due to age or medical condition, Dexamethasone and Ondansetron  Airway Management Planned: LMA  Additional Equipment: None  Intra-op Plan:   Post-operative Plan: Extubation in OR  Informed Consent: I have reviewed the patients History and Physical, chart, labs and discussed the procedure including the risks, benefits and alternatives for the proposed anesthesia with the patient or authorized representative who has indicated his/her understanding and acceptance.     Dental advisory given  Plan Discussed with: CRNA  Anesthesia Plan Comments:         Anesthesia Quick Evaluation

## 2023-02-28 NOTE — Transfer of Care (Cosign Needed)
Immediate Anesthesia Transfer of Care Note  Patient: Diamond Thompson  Procedure(s) Performed: DILATATION AND EVACUATION CHROMOSOME STUDIES  Patient Location: PACU  Anesthesia Type:General  Level of Consciousness: drowsy  Airway & Oxygen Therapy: Patient Spontanous Breathing, Patient connected to face mask oxygen, and Patient connected to face mask  Post-op Assessment: Report given to RN, Post -op Vital signs reviewed and stable, and Patient moving all extremities X 4  Post vital signs: Reviewed and stable  Last Vitals:  Vitals Value Taken Time  BP 119/70 02/28/23 1437  Temp    Pulse 84 02/28/23 1439  Resp 23 02/28/23 1439  SpO2 100 % 02/28/23 1439  Vitals shown include unfiled device data.  Last Pain:  Vitals:   02/28/23 1139  PainSc: 0-No pain      Patients Stated Pain Goal: 0 (02/28/23 1139)  Complications: No notable events documented.

## 2023-02-28 NOTE — Anesthesia Postprocedure Evaluation (Signed)
Anesthesia Post Note  Patient: Diamond Thompson  Procedure(s) Performed: DILATATION AND EVACUATION CHROMOSOME STUDIES     Patient location during evaluation: PACU Anesthesia Type: General Level of consciousness: awake and alert Pain management: pain level controlled Vital Signs Assessment: post-procedure vital signs reviewed and stable Respiratory status: spontaneous breathing, nonlabored ventilation and respiratory function stable Cardiovascular status: blood pressure returned to baseline Postop Assessment: no apparent nausea or vomiting Anesthetic complications: no   No notable events documented.  Last Vitals:  Vitals:   02/28/23 1500 02/28/23 1515  BP: 127/80 136/88  Pulse: 73 67  Resp: 20 (!) 21  Temp:  (!) 36.2 C  SpO2: 99% 100%                [name]

## 2023-02-28 NOTE — Op Note (Addendum)
Diamond Thompson PROCEDURE DATE: 02/28/2023  PREOPERATIVE DIAGNOSIS: 9 week missed abortion. POSTOPERATIVE DIAGNOSIS: The same. PROCEDURE:     Dilation and Evacuation. SURGEON:  Dr. Catalina Antigua  INDICATIONS: 37 y.o. G1P0000 with MAB at [redacted] weeks gestation with twin pregnancy, needing surgical completion.  Risks of surgery were discussed with the patient including but not limited to: bleeding which may require transfusion; infection which may require antibiotics; injury to uterus or surrounding organs;need for additional procedures including laparotomy or laparoscopy; possibility of intrauterine scarring which may impair future fertility; and other postoperative/anesthesia complications. Written informed consent was obtained.    FINDINGS:  A 10-week size anteverted uterus, moderate amounts of products of conception, specimen sent to pathology.  ANESTHESIA:    Monitored intravenous sedation, paracervical block. INTRAVENOUS FLUIDS:  200 ml of LR ESTIMATED BLOOD LOSS:  100 ml. SPECIMENS:  Products of conception sent to pathology COMPLICATIONS:  None immediate.  PROCEDURE DETAILS:  The patient received intravenous antibiotics while in the preoperative area.  She was then taken to the operating room where general anesthesia was administered and was found to be adequate.  After an adequate timeout was performed, she was placed in the dorsal lithotomy position and examined; then prepped and draped in the sterile manner.   Her bladder was catheterized for an unmeasured amount of clear, yellow urine. A vaginal speculum was then placed in the patient's vagina and a single tooth tenaculum was applied to the anterior lip of the cervix.  A paracervical block using 0.5% Marcaine was administered. The cervix was gently dilated to accommodate a 10 mm suction curette that was gently advanced to the uterine fundus.  The suction device was then activated and curette slowly rotated to clear the uterus of products  of conception.  A sharp curettage was then performed to confirm complete emptying of the uterus. There was minimal bleeding noted and the tenaculum removed with good hemostasis noted.   All instruments were removed from the patient's vagina. 1000 mcg cytotec was placed rectally to minimize bleeding. Portion of specimen also sent for chromosomal analysis. The patient tolerated the procedure well and was taken to the recovery area awake, and in stable condition.  The patient will be discharged to home as per PACU criteria.  Routine postoperative instructions given.  She will follow up in the clinic in 2 weeks for postoperative evaluation.

## 2023-03-01 ENCOUNTER — Encounter (HOSPITAL_COMMUNITY): Payer: Self-pay | Admitting: Obstetrics and Gynecology

## 2023-03-02 LAB — SURGICAL PATHOLOGY

## 2023-03-06 ENCOUNTER — Other Ambulatory Visit (HOSPITAL_BASED_OUTPATIENT_CLINIC_OR_DEPARTMENT_OTHER): Payer: Self-pay | Admitting: Obstetrics & Gynecology

## 2023-03-06 DIAGNOSIS — R7303 Prediabetes: Secondary | ICD-10-CM

## 2023-03-07 ENCOUNTER — Other Ambulatory Visit (HOSPITAL_COMMUNITY): Payer: Self-pay

## 2023-03-13 ENCOUNTER — Other Ambulatory Visit (HOSPITAL_COMMUNITY): Payer: Self-pay

## 2023-03-13 ENCOUNTER — Other Ambulatory Visit: Payer: Self-pay

## 2023-03-13 ENCOUNTER — Other Ambulatory Visit (INDEPENDENT_AMBULATORY_CARE_PROVIDER_SITE_OTHER): Payer: 59

## 2023-03-13 ENCOUNTER — Encounter (HOSPITAL_BASED_OUTPATIENT_CLINIC_OR_DEPARTMENT_OTHER): Payer: Self-pay | Admitting: Obstetrics & Gynecology

## 2023-03-13 ENCOUNTER — Ambulatory Visit (HOSPITAL_BASED_OUTPATIENT_CLINIC_OR_DEPARTMENT_OTHER): Payer: 59 | Admitting: Obstetrics & Gynecology

## 2023-03-13 VITALS — BP 123/77 | HR 85 | Ht 61.0 in | Wt 204.4 lb

## 2023-03-13 DIAGNOSIS — Z3A00 Weeks of gestation of pregnancy not specified: Secondary | ICD-10-CM

## 2023-03-13 DIAGNOSIS — Q928 Other specified trisomies and partial trisomies of autosomes: Secondary | ICD-10-CM | POA: Diagnosis not present

## 2023-03-13 DIAGNOSIS — O034 Incomplete spontaneous abortion without complication: Secondary | ICD-10-CM

## 2023-03-13 DIAGNOSIS — O364XX1 Maternal care for intrauterine death, fetus 1: Secondary | ICD-10-CM

## 2023-03-13 MED ORDER — MISOPROSTOL 200 MCG PO TABS
200.0000 ug | ORAL_TABLET | Freq: Four times a day (QID) | ORAL | 0 refills | Status: DC
  Filled 2023-03-13: qty 8, 2d supply, fill #0

## 2023-03-13 MED ORDER — DOXYCYCLINE HYCLATE 100 MG PO CAPS
100.0000 mg | ORAL_CAPSULE | Freq: Two times a day (BID) | ORAL | 0 refills | Status: DC
  Filled 2023-03-13: qty 14, 7d supply, fill #0

## 2023-03-13 NOTE — Progress Notes (Unsigned)
msm

## 2023-03-15 LAB — ANORA MISCARRIAGE TEST - FRESH

## 2023-03-19 ENCOUNTER — Telehealth (HOSPITAL_BASED_OUTPATIENT_CLINIC_OR_DEPARTMENT_OTHER): Payer: Self-pay | Admitting: *Deleted

## 2023-03-19 NOTE — Telephone Encounter (Signed)
LMOVM for pt to call office 

## 2023-03-19 NOTE — Telephone Encounter (Signed)
-----   Message from Jerene Bears sent at 03/16/2023  2:39 PM EST ----- Regarding: Follow-up Selena Batten, Can you call the patient on Monday and see if she took the Cytotec what was a response.  Would you need to rescan her if she did.  Thanks.  Lelon Mast

## 2023-03-20 ENCOUNTER — Telehealth (HOSPITAL_BASED_OUTPATIENT_CLINIC_OR_DEPARTMENT_OTHER): Payer: Self-pay | Admitting: *Deleted

## 2023-03-20 NOTE — Telephone Encounter (Signed)
Called pt to follow up on bleeding. Pt reports that she took the cytotec last Wednesday and had some bleeding after that. She states it was not heavy and lasted the day. No bleeding since. Advised that follow up ultrasound is recommended. Pt provided with appt.

## 2023-03-22 ENCOUNTER — Ambulatory Visit (HOSPITAL_BASED_OUTPATIENT_CLINIC_OR_DEPARTMENT_OTHER): Payer: 59 | Admitting: Obstetrics & Gynecology

## 2023-03-22 ENCOUNTER — Other Ambulatory Visit (INDEPENDENT_AMBULATORY_CARE_PROVIDER_SITE_OTHER): Payer: 59

## 2023-03-22 VITALS — BP 124/87 | HR 99 | Ht 61.0 in | Wt 209.8 lb

## 2023-03-22 DIAGNOSIS — Z3A00 Weeks of gestation of pregnancy not specified: Secondary | ICD-10-CM

## 2023-03-22 DIAGNOSIS — O034 Incomplete spontaneous abortion without complication: Secondary | ICD-10-CM | POA: Diagnosis not present

## 2023-03-22 DIAGNOSIS — O021 Missed abortion: Secondary | ICD-10-CM

## 2023-03-23 DIAGNOSIS — O034 Incomplete spontaneous abortion without complication: Secondary | ICD-10-CM | POA: Diagnosis not present

## 2023-03-24 LAB — BETA HCG QUANT (REF LAB): hCG Quant: 7 m[IU]/mL

## 2023-03-25 ENCOUNTER — Encounter (HOSPITAL_BASED_OUTPATIENT_CLINIC_OR_DEPARTMENT_OTHER): Payer: Self-pay | Admitting: Obstetrics & Gynecology

## 2023-03-25 NOTE — Progress Notes (Signed)
GYNECOLOGY  VISIT  CC:   follow up after D&E, retained products  HPI: 37 y.o. G1P0000 Married [place] or [place] American female here for follow up after using cytotec and being on abx for possible retained products.  Bleeding has stopped.  Denies pain or any vaginal odor.  HCG level recommended today.  MBT O-.  Has received Rhogam.  Ultrasound done at beside is more difficult due to present of fibroids.  Images with area that looks like retained products.  Pt is non tender with exam.    She is not sure about desires for attempting pregnancy yet.  Does not have genetic counseling appointment.  Referral has been made so will need to follow up with genetic counseling regarding this.    Past Medical History:  Diagnosis Date   Abnormal uterine bleeding (AUB) 01/13/2022   Allergy    Asthma    Cholelithiasis 05/28/2022   Fibroid    Infrequent menses 2019   Neck mass 05/28/2022   Pre-diabetes    Primary hypertension 12/05/2021   STD (sexually transmitted disease)    age 74?   Viral labyrinthitis of both ears 08/15/2022    MEDS:   Current Outpatient Medications on File Prior to Visit  Medication Sig Dispense Refill   doxycycline (VIBRAMYCIN) 100 MG capsule Take 1 capsule (100 mg total) by mouth 2 (two) times daily. Take with food as can cause GI distress. 14 capsule 0   ibuprofen (ADVIL) 600 MG tablet Take 1 tablet (600 mg total) by mouth every 6 (six) hours as needed. 60 tablet 3   metFORMIN (GLUCOPHAGE) 500 MG tablet TAKE 1 TABLET IN AM FOR 7 DAYS AND THEN INCREASED TO TWICE DAILY WITH MEALS. 180 tablet 0   misoprostol (CYTOTEC) 200 MCG tablet Take 1 tablet (200 mcg total) by mouth 4 (four) times daily. If no bleeding or cramping, repeat in 4 hours 8 tablet 0   NIFEdipine (PROCARDIA XL) 60 MG 24 hr tablet Take 1 tablet (60 mg total) by mouth daily. 30 tablet 2   omeprazole (PRILOSEC) 20 MG capsule Take 1 capsule (20 mg total) by mouth daily. (Patient not taking: Reported on 02/09/2023) 90  capsule 3   oxyCODONE-acetaminophen (PERCOCET/ROXICET) 5-325 MG tablet Take 1 tablet by mouth every 4 (four) hours as needed. (Patient not taking: Reported on 03/22/2023) 10 tablet 0   No current facility-administered medications on file prior to visit.    ALLERGIES: Fruit extracts, Kiwi extract, and Latex  SH:  married, non smoker  Review of Systems  Constitutional: Negative.   Genitourinary: Negative.     PHYSICAL EXAMINATION:    BP 124/87 (BP Location: Right Arm, Patient Position: Sitting)   Pulse 99   Ht 5\' 1"  (1.549 m) Comment: Reported  Wt 209 lb 12.8 oz (95.2 kg)   LMP 12/01/2022   BMI 39.64 kg/m     General appearance: alert, cooperative and appears stated age Abdomen: soft, non-tender; bowel sounds normal; no masses,  no organomegaly Lymph:  no inguinal LAD noted  Pelvic: External genitalia:  no lesions              Urethra:  normal appearing urethra with no masses, tenderness or lesions              Bartholins and Skenes: normal                 Cervix: no lesions and no blood  Bimanual Exam:  Uterus:  enlarged about 8 weeks, non tender  Chaperone, Raechel Ache, RN, was present for exam.  Assessment/Plan: 1. Retained products of conception after miscarriage (Primary) - US PELVIS TRANSVAGINAL NON-OB (TV ONLY) - Beta hCG quant  2. Missed abortion - with abnormal anora testing.  Will follow up with genetic counseling appt.

## 2023-03-26 ENCOUNTER — Encounter (HOSPITAL_BASED_OUTPATIENT_CLINIC_OR_DEPARTMENT_OTHER): Payer: Self-pay | Admitting: *Deleted

## 2023-03-26 ENCOUNTER — Other Ambulatory Visit (HOSPITAL_BASED_OUTPATIENT_CLINIC_OR_DEPARTMENT_OTHER): Payer: Self-pay | Admitting: *Deleted

## 2023-03-26 DIAGNOSIS — Q928 Other specified trisomies and partial trisomies of autosomes: Secondary | ICD-10-CM

## 2023-04-05 ENCOUNTER — Other Ambulatory Visit (HOSPITAL_BASED_OUTPATIENT_CLINIC_OR_DEPARTMENT_OTHER): Payer: 59

## 2023-04-05 DIAGNOSIS — O034 Incomplete spontaneous abortion without complication: Secondary | ICD-10-CM | POA: Diagnosis not present

## 2023-04-05 NOTE — Progress Notes (Signed)
Patient came in for lab draw only. tbw

## 2023-04-06 LAB — BETA HCG QUANT (REF LAB): hCG Quant: 3 m[IU]/mL

## 2023-04-07 ENCOUNTER — Encounter (HOSPITAL_BASED_OUTPATIENT_CLINIC_OR_DEPARTMENT_OTHER): Payer: Self-pay | Admitting: Obstetrics & Gynecology

## 2023-04-09 ENCOUNTER — Ambulatory Visit: Payer: 59

## 2023-04-09 ENCOUNTER — Ambulatory Visit: Payer: 59 | Attending: Obstetrics & Gynecology

## 2023-04-09 DIAGNOSIS — Z3169 Encounter for other general counseling and advice on procreation: Secondary | ICD-10-CM

## 2023-04-09 DIAGNOSIS — O285 Abnormal chromosomal and genetic finding on antenatal screening of mother: Secondary | ICD-10-CM

## 2023-04-09 NOTE — Addendum Note (Signed)
Addended by: Carolanne Grumbling on: 04/09/2023 04:40 PM   Modules accepted: Level of Service

## 2023-04-09 NOTE — Progress Notes (Addendum)
[facility] at [facility] [address], [address] Phone:  [phone]   Fax:  [phone]      Virtual Visit via Video Note  I connected with Diamond Thompson on 04/09/2023 at 3:00 pm by a video enabled telemedicine application and verified that I am speaking with the correct person using two identifiers.  Location: Patient: Home. Provider: Maternal Fetal Care.   I discussed the limitations of evaluation and management by telemedicine and the availability of in person appointments. The patient expressed understanding and agreed to proceed.  Genetic Counseling Clinic Note:   I spoke with 37 y.o. Diamond Thompson today to discuss genetics from her recent miscarriage and for preconception counseling. She was referred by Jerene Bears, MD.   Pregnancy History:    [id number].  Diamond Thompson recently had a first trimester miscarriage of a twin pregnancy. Products of conception was sent for Anora testing. The lab performed three dissections, as the second one had significant maternal cell contamination. The first and second dissections were genetically identical except for mosaic differences in chromosomes 5 and 18, indicating that they were identical twins or the dissection involved samples from the same fetus. 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 We discussed that 50%-70% of first trimester losses are due to chromosomal differences in the fetus. Most chromosome differences are a result of a random change in the sperm or egg that conceived the fetus; however, they may be caused by balanced chromosomal translocations in a member of the couple. A translocation occurs when there is an exchange in chromosome material, such as  when a segment of one chromosome breaks off and reattaches to a different chromosome. When the translocation is balanced, there is no extra or missing genetic material; however, an individual who carries a balanced translocation has an increased risk to pass unbalanced chromosomes to their offspring. In couples in which one partner is a carrier of a balanced translocation, this can result in an increased risk for infertility, recurrent pregnancy loss, and offspring with abnormal chromosomes. Chromosome translocations can be detected by karyotype. We reviewed that since the Anora testing showed abnormal chromosomes inherited from both Diamond Thompson and her reproductive partner Diamond Thompson, we can offer karyotyping for both. Diamond Thompson agreed to this. Diamond Thompson reports that they are not currently trying for a future pregnancy but may be considering it. Knowing if either Diamond Thompson or Diamond Thompson are balanced translocation carriers will allow [place] to provide recurrence risk estimates for a future pregnancy to be affected with a chromosome difference. Diamond Thompson will come in for a blood draw and will let us know when Diamond Thompson is available for his blood draw.   Diamond Thompson reports she is has pre-diabetes and hypertension but is otherwise healthy. She denies the use of cigarettes, alcohol, and street drugs.  Family History:    A three-generation pedigree was created and scanned into Epic under the Media tab.  Diamond Thompson reports that her reproductive partner Diamond Thompson's mother had a history of MS and IBS. We discussed that these conditions as well as diabetes and hypertension are typically multifactorial in nature and usually not due to a single genetic cause.  Family history not remarkable for individuals with birth defects, intellectual disability, autism spectrum disorder, multiple spontaneous abortions,  still births, or unexplained neonatal death.   Advanced Maternal Age:  We briefly discussed that the chance that a fetus would be  affected with a chromosome difference increases with advanced maternal age. If Diamond Thompson or Diamond Thompson were found to be balanced translocation carriers, the chance of having a pregnancy with a chromosome difference will be greater than the age-related risk.  Carrier Screening:  Since Diamond Thompson has not had carrier screening, we discussed available carrier screening options. Carrier screening is a genetic test that informs whether an individual is a carrier of certain autosomal recessive or X-linked conditions. Carriers typically do not exhibit symptoms but may be at an increased risk for having an affected child. If two individuals who are carriers for the same autosomal recessive condition have a child together, then the child would have a 1/4, or 25%, chance of being affected with the condition. Per the ACOG Committee Opinion 691, anyone considering a pregnancy or who is currently pregnant should be offered carrier screening for, at minimum, cystic fibrosis (CF), spinal muscular atrophy (SMA), and hemoglobinopathies. The mode of inheritance, clinical manifestations of these conditions, as well as details about testing were reviewed. Carrier screening panels may be limited to these three conditions or may be more expanded to include a wide range of autosomal recessive and X-linked genetic conditions. There are over 500 genes that can be tested for on a carrier screening panel. Some conditions may be severe and actionable, whereas other conditions may not yet be well-understood or may not have treatment options available. In the event that one partner were found to be a carrier for one or more conditions, carrier screening would be available for the other partner for those conditions. This will help inform current and future pregnancy risks for inheriting certain genetic conditions. A negative result on carrier screening reduces the likelihood of being a carrier, however, does not entirely rule out the possibility. A  negative result does not eliminate the chance for the couple's child to be affected with a genetic condition, as carrier screening does not evaluate all possible genetic conditions or all possible pathogenic variants for the genes included on the panel. If both partners were found to be carriers for the same condition, preconception genetic testing or prenatal genetic testing can be offered. Diamond Thompson elected carrier screening for [employer] that includes the four ACOG-recommended conditions.   Plan of Care:   Diamond Thompson will come in for a blood draw for karyotype and [employer] carrier screening (alpha thalassemia, beta-hemoglobinopathies, cystic fibrosis, and spinal muscular atrophy) on Tuesday 04/17/2023 at 2:30 pm. She will let us know when her partner is available for his blood to be drawn for karyotype and carrier screening.   Informed consent was obtained. All questions were answered.   I spent 30 minutes in the care of the patient today, including face-to-face time reviewing and  discussing the genetic test results and available next steps.    Thank you for sharing in the care of Tionni with [place].  Please do not hesitate to contact us at [phone] if you have any questions.   [name], MS Genetic Counselor   Genetic counseling student involved in appointment: No.

## 2023-04-11 DIAGNOSIS — O021 Missed abortion: Secondary | ICD-10-CM

## 2023-04-11 HISTORY — DX: Missed abortion: O02.1

## 2023-04-17 ENCOUNTER — Ambulatory Visit: Payer: 59 | Attending: Obstetrics and Gynecology

## 2023-04-17 DIAGNOSIS — Z31430 Encounter of female for testing for genetic disease carrier status for procreative management: Secondary | ICD-10-CM | POA: Diagnosis not present

## 2023-04-17 DIAGNOSIS — O285 Abnormal chromosomal and genetic finding on antenatal screening of mother: Secondary | ICD-10-CM

## 2023-04-17 NOTE — Addendum Note (Signed)
 Addended by: Carolanne Grumbling on: 04/17/2023 02:48 PM   Modules accepted: Orders

## 2023-04-20 ENCOUNTER — Other Ambulatory Visit (HOSPITAL_BASED_OUTPATIENT_CLINIC_OR_DEPARTMENT_OTHER): Payer: Self-pay | Admitting: Obstetrics & Gynecology

## 2023-04-20 ENCOUNTER — Other Ambulatory Visit (HOSPITAL_COMMUNITY): Payer: Self-pay

## 2023-04-20 DIAGNOSIS — R7303 Prediabetes: Secondary | ICD-10-CM

## 2023-04-20 MED ORDER — METFORMIN HCL 500 MG PO TABS: ORAL_TABLET | ORAL | 0 refills | Status: DC

## 2023-04-24 LAB — CHROMOSOME, BLOOD, ROUTINE
Cells Analyzed: 20
Cells Counted: 20
Cells Karyotyped: 2
GTG Band Resolution Achieved: 500

## 2023-04-25 ENCOUNTER — Telehealth: Payer: Self-pay

## 2023-04-25 NOTE — Telephone Encounter (Signed)
 I spoke with the patient to return her karyotype results that were drawn to assess for balanced parental translocation due to abnormal chromosomes on a POC sample. Her karyotype returned as normal female 56, XX.  Her partner Laird Pih 10/25[birth year]1991 also had his blood drawn for karyotype. [facility] cancelled the test as they report that he has already had the test done in 2018. However, it seems that the middle names and the states of residence do not match between both patients. Therefore, I have called the lab to request that they still run the sample.  Moreover, both Amyriah and Veryl Gottron had [employer] carrier screening drawn. Results are pending.  Georgean Kindle, MS Genetic Counselor [facility] [phone]

## 2023-04-26 LAB — HORIZON CUSTOM: REPORT SUMMARY: POSITIVE — AB

## 2023-05-02 ENCOUNTER — Other Ambulatory Visit (HOSPITAL_BASED_OUTPATIENT_CLINIC_OR_DEPARTMENT_OTHER): Payer: Self-pay | Admitting: Obstetrics & Gynecology

## 2023-05-02 ENCOUNTER — Other Ambulatory Visit (HOSPITAL_COMMUNITY): Payer: Self-pay

## 2023-05-02 DIAGNOSIS — N979 Female infertility, unspecified: Secondary | ICD-10-CM

## 2023-05-02 MED ORDER — LETROZOLE 2.5 MG PO TABS
ORAL_TABLET | ORAL | 0 refills | Status: DC
  Filled 2023-05-02: qty 10, 5d supply, fill #0

## 2023-05-04 ENCOUNTER — Other Ambulatory Visit (HOSPITAL_COMMUNITY): Payer: Self-pay

## 2023-05-04 ENCOUNTER — Telehealth: Payer: Self-pay

## 2023-05-04 NOTE — Telephone Encounter (Signed)
I left a message asking the patient to call back to discuss test results.

## 2023-05-08 ENCOUNTER — Telehealth: Payer: Self-pay

## 2023-05-08 NOTE — Telephone Encounter (Signed)
I spoke with the patient to return her and her partner's Diamond Thompson DOB 10/25/1991) karyotype and carrier screening results.  [name]'s karyotype returned as normal female 39, XX. Diamond Thompson's karyotype returned as normal female 35, XY. Therefore, the lab did not detect any balanced translocations. The chance this couple has a future pregnancy with a chromosome difference is low, around 1%.  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 She screened negative for the other three conditions (CF, SMA, and beta-hemoglobinopathies). Please see report for details.  [name] was not found to be a carrier for alpha thalassemia, beta-hemoglobinopathies, CF, and SMA. Please see report for details. The chance this couple's current and future pregnancies would be affected with alpha thalassemia is very low. A negative result on carrier screening reduces but does not eliminate the chance of being a carrier.   [name], MS Genetic Counselor [facility] for M[facility]etal Care [phone]

## 2023-05-24 ENCOUNTER — Other Ambulatory Visit (HOSPITAL_BASED_OUTPATIENT_CLINIC_OR_DEPARTMENT_OTHER): Payer: 59

## 2023-05-24 ENCOUNTER — Encounter (HOSPITAL_BASED_OUTPATIENT_CLINIC_OR_DEPARTMENT_OTHER): Payer: Self-pay

## 2023-05-24 DIAGNOSIS — N979 Female infertility, unspecified: Secondary | ICD-10-CM | POA: Diagnosis not present

## 2023-05-25 ENCOUNTER — Encounter (HOSPITAL_BASED_OUTPATIENT_CLINIC_OR_DEPARTMENT_OTHER): Payer: Self-pay | Admitting: Obstetrics & Gynecology

## 2023-05-25 LAB — PROGESTERONE: Progesterone: 9.7 ng/mL

## 2023-05-29 ENCOUNTER — Ambulatory Visit: Payer: Self-pay

## 2023-05-29 ENCOUNTER — Other Ambulatory Visit (HOSPITAL_COMMUNITY): Payer: Self-pay

## 2023-05-30 ENCOUNTER — Other Ambulatory Visit (HOSPITAL_BASED_OUTPATIENT_CLINIC_OR_DEPARTMENT_OTHER): Payer: Self-pay

## 2023-05-30 ENCOUNTER — Other Ambulatory Visit (HOSPITAL_COMMUNITY): Payer: Self-pay

## 2023-05-30 MED ORDER — METFORMIN HCL 500 MG PO TABS
500.0000 mg | ORAL_TABLET | Freq: Two times a day (BID) | ORAL | 0 refills | Status: DC
  Filled 2023-05-30: qty 180, 90d supply, fill #0

## 2023-05-31 ENCOUNTER — Other Ambulatory Visit (HOSPITAL_COMMUNITY): Payer: Self-pay

## 2023-05-31 ENCOUNTER — Ambulatory Visit: Payer: 59 | Admitting: Nurse Practitioner

## 2023-05-31 ENCOUNTER — Encounter: Payer: Self-pay | Admitting: Nurse Practitioner

## 2023-05-31 VITALS — BP 128/86 | HR 123 | Temp 100.9°F | Ht 61.0 in | Wt 209.4 lb

## 2023-05-31 DIAGNOSIS — N912 Amenorrhea, unspecified: Secondary | ICD-10-CM

## 2023-05-31 DIAGNOSIS — R6889 Other general symptoms and signs: Secondary | ICD-10-CM

## 2023-05-31 LAB — POCT INFLUENZA A/B
Influenza A, POC: NEGATIVE
Influenza B, POC: NEGATIVE

## 2023-05-31 LAB — POCT URINE PREGNANCY: Preg Test, Ur: NEGATIVE

## 2023-05-31 LAB — POC COVID19 BINAXNOW: SARS Coronavirus 2 Ag: NEGATIVE

## 2023-05-31 MED ORDER — OSELTAMIVIR PHOSPHATE 75 MG PO CAPS
75.0000 mg | ORAL_CAPSULE | Freq: Two times a day (BID) | ORAL | 0 refills | Status: DC
  Filled 2023-05-31: qty 10, 5d supply, fill #0

## 2023-05-31 NOTE — Progress Notes (Signed)
Acute Office Visit  Subjective:    Patient ID: Diamond Thompson, female    DOB: 25-Mar-1986, 38 y.o.   MRN: [id number]  Chief Complaint  Patient presents with   Cough    Runny nose, headache for 1 day, request urine pregnancy test    URI  The current episode started yesterday. The problem has been unchanged. The maximum temperature recorded prior to her arrival was 100.4 - 100.9 F. The fever has been present for 1 to 2 days. Associated symptoms include congestion, coughing, headaches, joint pain, rhinorrhea, sinus pain, sneezing and wheezing. Pertinent negatives include no abdominal pain, chest pain, diarrhea, dysuria, ear pain, joint swelling, nausea, neck pain, plugged ear sensation, rash, sore throat, swollen glands or vomiting. She has tried decongestant and acetaminophen for the symptoms. The treatment provided no relief.  She works in [employer], therefore high risk of flu exposure  Outpatient Medications Prior to Visit  Medication Sig   ibuprofen (ADVIL) 600 MG tablet Take 1 tablet (600 mg total) by mouth every 6 (six) hours as needed.   metFORMIN (GLUCOPHAGE) 500 MG tablet Take 1 tablet in the morning for 7 days then Take 1 tablet (500 mg total) by mouth 2 (two) times daily with a meal.   NIFEdipine (PROCARDIA XL) 60 MG 24 hr tablet Take 1 tablet (60 mg total) by mouth daily.   omeprazole (PRILOSEC) 20 MG capsule Take 1 capsule (20 mg total) by mouth daily.   letrozole (FEMARA) 2.5 MG tablet Take 2 tablets daily, days 4-8 of menstrual cycle (Patient not taking: Reported on 05/31/2023)   metFORMIN (GLUCOPHAGE) 500 MG tablet TAKE 1 TABLET IN AM FOR 7 DAYS AND THEN INCREASED TO TWICE DAILY WITH MEALS. (Patient not taking: Reported on 05/31/2023)   No facility-administered medications prior to visit.   Reviewed past medical and social history.  Review of Systems  HENT:  Positive for congestion, rhinorrhea, sinus pain and sneezing. Negative for ear pain and sore throat.    Respiratory:  Positive for cough and wheezing.   Cardiovascular:  Negative for chest pain.  Gastrointestinal:  Negative for abdominal pain, diarrhea, nausea and vomiting.  Genitourinary:  Negative for dysuria.  Musculoskeletal:  Positive for joint pain. Negative for neck pain.  Skin:  Negative for rash.  Neurological:  Positive for headaches.   Per HPI     Objective:    Physical Exam Vitals and nursing note reviewed.  Constitutional:      General: She is not in acute distress. HENT:     Right Ear: Tympanic membrane, ear canal and external ear normal.     Left Ear: Tympanic membrane, ear canal and external ear normal.     Nose: Congestion and rhinorrhea present. No nasal tenderness or mucosal edema.     Right Nostril: No occlusion.     Left Nostril: No occlusion.     Right Turbinates: Not enlarged, swollen or pale.     Left Turbinates: Not enlarged, swollen or pale.     Right Sinus: No maxillary sinus tenderness or frontal sinus tenderness.     Left Sinus: No maxillary sinus tenderness or frontal sinus tenderness.     Mouth/Throat:     Pharynx: Oropharynx is clear. Uvula midline.     Tonsils: No tonsillar exudate or tonsillar abscesses.  Eyes:     Extraocular Movements: Extraocular movements intact.     Conjunctiva/sclera: Conjunctivae normal.  Cardiovascular:     Rate and Rhythm: Normal rate and regular  rhythm.     Pulses: Normal pulses.     Heart sounds: Normal heart sounds.  Pulmonary:     Effort: Pulmonary effort is normal.     Breath sounds: Normal breath sounds.  Musculoskeletal:     Cervical back: Normal range of motion and neck supple.  Lymphadenopathy:     Cervical: No cervical adenopathy.  Neurological:     Mental Status: She is alert and oriented to person, place, and time.     BP 128/86 (BP Location: Right Arm, Patient Position: Sitting)   Pulse (!) 123   Temp (!) 100.9 F (38.3 C) (Oral)   Ht 5\' 1"  (1.549 m)   Wt 209 lb 6.4 oz (95 kg)   LMP  05/02/2023   SpO2 100%   Breastfeeding No   BMI 39.57 kg/m    Results for orders placed or performed in visit on 05/31/23  POC COVID-19  Result Value Ref Range   SARS Coronavirus 2 Ag Negative Negative  POCT Influenza A/B  Result Value Ref Range   Influenza A, POC Negative Negative   Influenza B, POC Negative Negative  POCT urine pregnancy  Result Value Ref Range   Preg Test, Ur Negative Negative      Assessment & Plan:   Problem List Items Addressed This Visit   None Visit Diagnoses       Flu-like symptoms    -  Primary   Relevant Medications   oseltamivir (TAMIFLU) 75 MG capsule   Other Relevant Orders   POC COVID-19 (Completed)   POCT Influenza A/B (Completed)     Amenorrhea       Relevant Orders   POCT urine pregnancy (Completed)      Meds ordered this encounter  Medications   oseltamivir (TAMIFLU) 75 MG capsule    Sig: Take 1 capsule (75 mg total) by mouth 2 (two) times daily.    Dispense:  10 capsule    Refill:  0    Supervising Provider:   Mliss Sax [[id number]]   Return if symptoms worsen or fail to improve.    Alysia Penna, NP

## 2023-05-31 NOTE — Patient Instructions (Signed)
URI Instructions: Encourage adequate oral hydration. Use over-the-counter  cold" medicine  such as Dayquil/Nyquil/Theraflu/Alkaseltzer for cough and sinus congestion. Use mucinex DM or Robitussin  or delsym for cough without sinus congestion  You can use plain "Tylenol" or "Advil" for fever, chills and achyness. Use cool mist humidifier at bedtime to help with nasal congestion and cough.  Cold/cough medications may have tylenol or ibuprofen or guaifenesin or dextromethophan in them, so be careful not to take beyond the recommended dose for each of these medications.

## 2023-06-01 ENCOUNTER — Other Ambulatory Visit (HOSPITAL_BASED_OUTPATIENT_CLINIC_OR_DEPARTMENT_OTHER): Payer: Self-pay | Admitting: Obstetrics & Gynecology

## 2023-06-01 ENCOUNTER — Other Ambulatory Visit (HOSPITAL_COMMUNITY): Payer: Self-pay

## 2023-06-01 DIAGNOSIS — N979 Female infertility, unspecified: Secondary | ICD-10-CM

## 2023-06-01 MED ORDER — LETROZOLE 2.5 MG PO TABS
ORAL_TABLET | ORAL | 0 refills | Status: DC
  Filled 2023-06-01: qty 15, 5d supply, fill #0

## 2023-06-02 ENCOUNTER — Other Ambulatory Visit (HOSPITAL_COMMUNITY): Payer: Self-pay

## 2023-06-07 ENCOUNTER — Ambulatory Visit: Payer: 59 | Admitting: Family Medicine

## 2023-06-13 ENCOUNTER — Ambulatory Visit (INDEPENDENT_AMBULATORY_CARE_PROVIDER_SITE_OTHER): Payer: 59

## 2023-06-13 ENCOUNTER — Ambulatory Visit (HOSPITAL_BASED_OUTPATIENT_CLINIC_OR_DEPARTMENT_OTHER): Payer: 59 | Admitting: Obstetrics & Gynecology

## 2023-06-13 ENCOUNTER — Encounter (HOSPITAL_BASED_OUTPATIENT_CLINIC_OR_DEPARTMENT_OTHER): Payer: Self-pay | Admitting: Obstetrics & Gynecology

## 2023-06-13 VITALS — BP 122/88 | HR 84 | Ht 61.0 in | Wt 206.6 lb

## 2023-06-13 DIAGNOSIS — D259 Leiomyoma of uterus, unspecified: Secondary | ICD-10-CM | POA: Diagnosis not present

## 2023-06-13 DIAGNOSIS — D251 Intramural leiomyoma of uterus: Secondary | ICD-10-CM

## 2023-06-13 DIAGNOSIS — R7303 Prediabetes: Secondary | ICD-10-CM

## 2023-06-13 DIAGNOSIS — D252 Subserosal leiomyoma of uterus: Secondary | ICD-10-CM | POA: Diagnosis not present

## 2023-06-13 DIAGNOSIS — E282 Polycystic ovarian syndrome: Secondary | ICD-10-CM

## 2023-06-13 DIAGNOSIS — N970 Female infertility associated with anovulation: Secondary | ICD-10-CM

## 2023-06-20 ENCOUNTER — Encounter (HOSPITAL_BASED_OUTPATIENT_CLINIC_OR_DEPARTMENT_OTHER): Payer: Self-pay | Admitting: Obstetrics & Gynecology

## 2023-06-20 NOTE — Progress Notes (Signed)
 GYNECOLOGY  VISIT  CC:   Discuss ultrasound results, desires pregnancy  HPI: 38 y.o. G25P0000 Married [place] or [place] American female here for discussion of ultrasound results.  Ultrasound obtained to assess follicular development as she is using femara.  H/o miscarriage of twin pregnancy due to missed ab.  Anora testing showed chromosomal abnormality.  Pt has undergone chromosomal testing herself and this was normal.  On ultrasound today, uteru si s 8.3 x 5.3 x 5.5cm with small fibroids present.  Largest measuring 1.8cm x 1.8cm.  Endometrium is 3.80mm.  both ovaries with follicle with largest measuring about 20ml.  This all appears appropriate given timing of most recent Femara.  She is taking 5mg  starting typically on day 5 of cycle.  Questions answered.   Past Medical History:  Diagnosis Date   Abnormal uterine bleeding (AUB) 01/13/2022   Allergy    Asthma    Cholelithiasis 05/28/2022   Fibroid    Infrequent menses 2019   Neck mass 05/28/2022   Pre-diabetes    Primary hypertension 12/05/2021   STD (sexually transmitted disease)    age 38?   Viral labyrinthitis of both ears 08/15/2022    MEDS:   Current Outpatient Medications on File Prior to Visit  Medication Sig Dispense Refill   metFORMIN (GLUCOPHAGE) 500 MG tablet Take 1 tablet in the morning for 7 days then Take 1 tablet (500 mg total) by mouth 2 (two) times daily with a meal. 180 tablet 0   NIFEdipine (PROCARDIA XL) 60 MG 24 hr tablet Take 1 tablet (60 mg total) by mouth daily. 30 tablet 2   letrozole (FEMARA) 2.5 MG tablet Take 3 tablets daily, days 5-9 of menstrual cycle (Patient not taking: Reported on 06/13/2023) 15 tablet 0   omeprazole (PRILOSEC) 20 MG capsule Take 1 capsule (20 mg total) by mouth daily. (Patient not taking: Reported on 06/13/2023) 90 capsule 3   No current facility-administered medications on file prior to visit.    ALLERGIES: Fruit extracts, Kiwi extract, and Latex  SH:  married, non  smoker  Review of Systems  Constitutional: Negative.   Genitourinary: Negative.     PHYSICAL EXAMINATION:    BP 122/88 (BP Location: Right Arm, Patient Position: Sitting, Cuff Size: Large)   Pulse 84   Ht 5\' 1"  (1.549 m)   Wt 206 lb 9.6 oz (93.7 kg)   LMP 05/02/2023   BMI 39.04 kg/m      Physical Exam Constitutional:      Appearance: Normal appearance.  Neurological:     General: No focal deficit present.     Mental Status: She is alert.  Psychiatric:        Mood and Affect: Mood normal.     Assessment/Plan: 1. PCOS (polycystic ovarian syndrome) (Primary) - continue metformin and femara - consider REI referral if not pregnant after 6 months of Femara or sooner if desired by pt  2. Intramural and subserous leiomyoma of uterus  3. Prediabetes

## 2023-06-22 ENCOUNTER — Encounter (HOSPITAL_BASED_OUTPATIENT_CLINIC_OR_DEPARTMENT_OTHER): Payer: Self-pay

## 2023-06-22 ENCOUNTER — Other Ambulatory Visit (HOSPITAL_BASED_OUTPATIENT_CLINIC_OR_DEPARTMENT_OTHER): Payer: 59

## 2023-06-22 DIAGNOSIS — N979 Female infertility, unspecified: Secondary | ICD-10-CM | POA: Diagnosis not present

## 2023-06-22 DIAGNOSIS — N970 Female infertility associated with anovulation: Secondary | ICD-10-CM | POA: Diagnosis not present

## 2023-06-23 LAB — PROGESTERONE: Progesterone: 20.9 ng/mL

## 2023-06-25 ENCOUNTER — Encounter (HOSPITAL_BASED_OUTPATIENT_CLINIC_OR_DEPARTMENT_OTHER): Payer: Self-pay | Admitting: Obstetrics & Gynecology

## 2023-07-02 ENCOUNTER — Other Ambulatory Visit (HOSPITAL_BASED_OUTPATIENT_CLINIC_OR_DEPARTMENT_OTHER): Payer: Self-pay | Admitting: Obstetrics & Gynecology

## 2023-07-02 ENCOUNTER — Other Ambulatory Visit (HOSPITAL_COMMUNITY): Payer: Self-pay

## 2023-07-02 DIAGNOSIS — N979 Female infertility, unspecified: Secondary | ICD-10-CM

## 2023-07-02 DIAGNOSIS — I10 Essential (primary) hypertension: Secondary | ICD-10-CM

## 2023-07-02 MED ORDER — LETROZOLE 2.5 MG PO TABS
ORAL_TABLET | ORAL | 0 refills | Status: DC
  Filled 2023-07-02: qty 15, 84d supply, fill #0

## 2023-07-03 ENCOUNTER — Other Ambulatory Visit (HOSPITAL_COMMUNITY): Payer: Self-pay

## 2023-07-03 NOTE — Telephone Encounter (Signed)
 LMOVM; is pt taking?

## 2023-07-04 ENCOUNTER — Other Ambulatory Visit: Payer: Self-pay

## 2023-07-04 ENCOUNTER — Other Ambulatory Visit (HOSPITAL_COMMUNITY): Payer: Self-pay

## 2023-07-04 MED ORDER — NIFEDIPINE ER OSMOTIC RELEASE 60 MG PO TB24
60.0000 mg | ORAL_TABLET | Freq: Every day | ORAL | 7 refills | Status: DC
  Filled 2023-07-04: qty 30, 30d supply, fill #0
  Filled 2023-08-08: qty 30, 30d supply, fill #1
  Filled 2023-11-02 – 2023-11-21 (×2): qty 30, 30d supply, fill #2
  Filled 2024-01-08: qty 30, 30d supply, fill #3
  Filled 2024-02-20 – 2024-03-10 (×2): qty 30, 30d supply, fill #4

## 2023-07-04 NOTE — Telephone Encounter (Signed)
 Called and spoke with patient to verify if patient is currently taking this medication. Patient states that she was never told to stop it and she is indeed still taking this medication. tbw

## 2023-07-04 NOTE — Telephone Encounter (Signed)
 Refill rx until patient follows up for annual visit in November. tbw

## 2023-07-04 NOTE — Telephone Encounter (Signed)
 Will refill until November. Patient should have Annual exam. tbw

## 2023-07-30 ENCOUNTER — Ambulatory Visit (HOSPITAL_BASED_OUTPATIENT_CLINIC_OR_DEPARTMENT_OTHER): Admitting: *Deleted

## 2023-07-30 ENCOUNTER — Encounter (HOSPITAL_BASED_OUTPATIENT_CLINIC_OR_DEPARTMENT_OTHER): Payer: Self-pay

## 2023-07-30 VITALS — BP 106/68 | HR 96 | Ht 60.0 in | Wt 208.8 lb

## 2023-07-30 DIAGNOSIS — N926 Irregular menstruation, unspecified: Secondary | ICD-10-CM

## 2023-07-30 DIAGNOSIS — Z3201 Encounter for pregnancy test, result positive: Secondary | ICD-10-CM | POA: Diagnosis not present

## 2023-07-30 DIAGNOSIS — O3680X0 Pregnancy with inconclusive fetal viability, not applicable or unspecified: Secondary | ICD-10-CM

## 2023-07-30 LAB — POCT URINE PREGNANCY: Preg Test, Ur: POSITIVE — AB

## 2023-07-30 NOTE — Progress Notes (Signed)
   NURSE VISIT- PREGNANCY CONFIRMATION   SUBJECTIVE:  Enaya Howze is a 38 y.o. G2P0000 female at [redacted]w[redacted]d by certain LMP of [name] was 06/29/2023. Here for pregnancy confirmation.  Home pregnancy test: positive x 1   She reports no complaints.  She is taking prenatal vitamins.    OBJECTIVE:  BP 106/68 (Cuff Size: Large)   Pulse 96   Ht 5' (1.524 m)   Wt 208 lb 12.8 oz (94.7 kg)   LMP 06/29/2023   BMI 40.78 kg/m   Appears well, in no apparent distress  No results found for this or any previous visit (from the past 24 hours).  ASSESSMENT: Positive pregnancy test, [redacted]w[redacted]d by LMP    PLAN: Prenatal vitamins: continue   Nausea medicines: not currently needed   Pt provided with appt for viability scan  Maudine Sos, RN

## 2023-07-30 NOTE — Addendum Note (Signed)
 Addended by: Marliss Simple on: 07/30/2023 11:46 AM   Modules accepted: Orders

## 2023-07-31 LAB — BETA HCG QUANT (REF LAB): hCG Quant: 1236 m[IU]/mL

## 2023-08-05 ENCOUNTER — Emergency Department (HOSPITAL_BASED_OUTPATIENT_CLINIC_OR_DEPARTMENT_OTHER): Admission: EM | Admit: 2023-08-05 | Discharge: 2023-08-05 | Disposition: A | Discharge status: Home / Self Care

## 2023-08-05 ENCOUNTER — Encounter (HOSPITAL_BASED_OUTPATIENT_CLINIC_OR_DEPARTMENT_OTHER): Payer: Self-pay

## 2023-08-05 ENCOUNTER — Other Ambulatory Visit: Payer: Self-pay

## 2023-08-05 DIAGNOSIS — O209 Hemorrhage in early pregnancy, unspecified: Secondary | ICD-10-CM | POA: Insufficient documentation

## 2023-08-05 DIAGNOSIS — O4690 Antepartum hemorrhage, unspecified, unspecified trimester: Secondary | ICD-10-CM

## 2023-08-05 DIAGNOSIS — Z91040 Latex allergy status: Secondary | ICD-10-CM | POA: Insufficient documentation

## 2023-08-05 DIAGNOSIS — Z3A01 Less than 8 weeks gestation of pregnancy: Secondary | ICD-10-CM | POA: Diagnosis not present

## 2023-08-05 LAB — CBC
HCT: 41.2 % (ref 36.0–46.0)
Hemoglobin: 13.1 g/dL (ref 12.0–15.0)
MCH: 24.3 pg — ABNORMAL LOW (ref 26.0–34.0)
MCHC: 31.8 g/dL (ref 30.0–36.0)
MCV: 76.6 fL — ABNORMAL LOW (ref 80.0–100.0)
Platelets: 475 10*3/uL — ABNORMAL HIGH (ref 150–400)
RBC: 5.38 MIL/uL — ABNORMAL HIGH (ref 3.87–5.11)
RDW: 16 % — ABNORMAL HIGH (ref 11.5–15.5)
WBC: 10.8 10*3/uL — ABNORMAL HIGH (ref 4.0–10.5)
nRBC: 0 % (ref 0.0–0.2)

## 2023-08-05 LAB — HCG, QUANTITATIVE, PREGNANCY: hCG, Beta Chain, Quant, S: 7143 m[IU]/mL — ABNORMAL HIGH (ref <5)

## 2023-08-05 LAB — URINALYSIS, ROUTINE W REFLEX MICROSCOPIC
Bilirubin Urine: NEGATIVE
Glucose, UA: NEGATIVE mg/dL
Ketones, ur: NEGATIVE mg/dL
Leukocytes,Ua: NEGATIVE
Nitrite: NEGATIVE
Specific Gravity, Urine: 1.017 (ref 1.005–1.030)
pH: 6 (ref 5.0–8.0)

## 2023-08-05 NOTE — ED Triage Notes (Signed)
 Pt states that she is approx [redacted] weeks pregnant and noticed dark blood in her underwear after church approx 45 mins ago. Denies pain and/or cramping.

## 2023-08-05 NOTE — ED Provider Notes (Signed)
  EMERGENCY DEPARTMENT AT [facility] Provider Note   CSN: [id number] Arrival date & time: 08/05/23  1929     History  Chief Complaint  Patient presents with   Vaginal Bleeding    Diamond Thompson is a 38 y.o. female presents today for vaginal bleeding that started approximately 45 minutes ago.  Patient states that she is approximately [redacted] weeks pregnant.  Patient denies fever, chills pain, cramping, dizziness, chest pain, or shortness of breath.   Vaginal Bleeding      Home Medications Prior to Admission medications   Medication Sig Start Date End Date Taking? Authorizing Provider  metFORMIN  (GLUCOPHAGE ) 500 MG tablet Take 1 tablet in the morning for 7 days then Take 1 tablet (500 mg total) by mouth 2 (two) times daily with a meal. 04/20/23     NIFEdipine  (PROCARDIA  XL) 60 MG 24 hr tablet Take 1 tablet (60 mg total) by mouth daily. 07/04/23   Lillian Rein, MD  omeprazole  (PRILOSEC) 20 MG capsule Take 1 capsule (20 mg total) by mouth daily. Patient not taking: Reported on 06/13/2023 06/05/22 06/28/23  Catheryn Cluck, MD  Prenatal Vit-Fe Fumarate-FA (MULTIVITAMIN-PRENATAL) 27-0.8 MG TABS tablet Take 1 tablet by mouth daily at 12 noon.    [provider]      Allergies    Fruit extracts, Kiwi extract, and Latex    Review of Systems   Review of Systems  Genitourinary:  Positive for vaginal bleeding.    Physical Exam Updated Vital Signs BP (!) 142/98   Pulse (!) 103   Temp 98.3 F (36.8 C) (Oral)   Resp 15   LMP 06/29/2023   SpO2 100%  Physical Exam Vitals and nursing note reviewed.  Constitutional:      General: She is not in acute distress.    Appearance: Normal appearance. She is well-developed. She is not ill-appearing, toxic-appearing or diaphoretic.  HENT:     Head: Normocephalic and atraumatic.     Nose: Nose normal.  Eyes:     Extraocular Movements: Extraocular movements intact.     Conjunctiva/sclera: Conjunctivae normal.   Cardiovascular:     Rate and Rhythm: Normal rate and regular rhythm.     Pulses: Normal pulses.     Heart sounds: Normal heart sounds.  Pulmonary:     Effort: Pulmonary effort is normal. No respiratory distress.     Breath sounds: Normal breath sounds.  Abdominal:     Palpations: Abdomen is soft.     Tenderness: There is no abdominal tenderness.  Musculoskeletal:        General: No swelling.     Cervical back: Normal range of motion and neck supple.  Skin:    General: Skin is warm and dry.     Capillary Refill: Capillary refill takes less than 2 seconds.  Neurological:     General: No focal deficit present.     Mental Status: She is alert.  Psychiatric:        Mood and Affect: Mood normal.     ED Results / Procedures / Treatments   Labs (all labs ordered are listed, but only abnormal results are displayed) Labs Reviewed  CBC - Abnormal; Notable for the following components:      Result Value   WBC 10.8 (*)    RBC 5.38 (*)    MCV 76.6 (*)    MCH 24.3 (*)    RDW 16.0 (*)    Platelets 475 (*)  All other components within normal limits  HCG, QUANTITATIVE, PREGNANCY - Abnormal; Notable for the following components:   hCG, Beta Chain, Quant, S 7,143 (*)    All other components within normal limits  URINALYSIS, ROUTINE W REFLEX MICROSCOPIC - Abnormal; Notable for the following components:   Hgb urine dipstick LARGE (*)    Protein, ur TRACE (*)    Bacteria, UA FEW (*)    All other components within normal limits    EKG None  Radiology No results found.  Procedures Procedures    Medications Ordered in ED Medications - No data to display  ED Course/ Medical Decision Making/ A&P                                 Medical Decision Making Amount and/or Complexity of Data Reviewed Labs: ordered.   This patient presents to the ED for concern of vaginal bleeding, this involves an extensive number of treatment options, and is a complaint that carries with it a  high risk of complications and morbidity.  The differential diagnosis includes threatened abortion, vaginal bleeding in pregnancy   Co morbidities that complicate the patient evaluation  Fibroid, abnormal uterine bleeding, PCOS   Additional history obtained: External records from outside source obtained and reviewed including OB/GYN documentation   Lab Tests:  I Ordered, and personally interpreted labs.  The pertinent results include: Mild leukocytosis at 10.8, UA with large hemoglobin and trace protein, few bacteria, 6-10 squamous epithelials, quantitative hCG 7143  Test / Admission - Considered:  Considered for admission or further evaluation however patient's vital signs, physical exam, and labs are reassuring.  Patient given ambulatory referral for ultrasound OB less than 14 weeks as ultrasound is not currently available.  Patient given return precautions.  I feel patient is safe for discharge at this time.        Final Clinical Impression(s) / ED Diagnoses Final diagnoses:  Vaginal bleeding in pregnancy    Rx / DC Orders ED Discharge Orders          Ordered    US  OP OB Comp Less 14 Wks        08/05/23 2122              Carie Charity, PA-C 08/05/23 2124    Rolinda Climes, DO 08/05/23 2333

## 2023-08-05 NOTE — Discharge Instructions (Addendum)
 Today you were seen for vaginal bleeding during pregnancy.  You have an outpatient ultrasound order as we do not have ultrasound available at this time.  Please sure to complete this imaging.  Please return to the ED if you have heavy vaginal bleeding, dizziness, or pelvic cramping.  Thank you for letting us  treat you today. After reviewing your labs, I feel you are safe to go home. Please follow up with your PCP in the next several days and provide them with your records from this visit. Return to the Emergency Room if pain becomes severe or symptoms worsen.

## 2023-08-06 ENCOUNTER — Ambulatory Visit (HOSPITAL_BASED_OUTPATIENT_CLINIC_OR_DEPARTMENT_OTHER)
Admission: RE | Admit: 2023-08-06 | Discharge: 2023-08-06 | Disposition: A | Discharge status: Home / Self Care | Source: Ambulatory Visit | Attending: Family Medicine | Admitting: Family Medicine

## 2023-08-06 ENCOUNTER — Other Ambulatory Visit (HOSPITAL_BASED_OUTPATIENT_CLINIC_OR_DEPARTMENT_OTHER): Payer: Self-pay | Admitting: Emergency Medicine

## 2023-08-06 DIAGNOSIS — O4690 Antepartum hemorrhage, unspecified, unspecified trimester: Secondary | ICD-10-CM

## 2023-08-06 DIAGNOSIS — O30041 Twin pregnancy, dichorionic/diamniotic, first trimester: Secondary | ICD-10-CM | POA: Diagnosis not present

## 2023-08-06 DIAGNOSIS — O3481 Maternal care for other abnormalities of pelvic organs, first trimester: Secondary | ICD-10-CM | POA: Diagnosis not present

## 2023-08-06 DIAGNOSIS — O09511 Supervision of elderly primigravida, first trimester: Secondary | ICD-10-CM | POA: Diagnosis not present

## 2023-08-06 DIAGNOSIS — O209 Hemorrhage in early pregnancy, unspecified: Secondary | ICD-10-CM | POA: Diagnosis not present

## 2023-08-08 ENCOUNTER — Other Ambulatory Visit (HOSPITAL_COMMUNITY): Payer: Self-pay

## 2023-08-08 ENCOUNTER — Other Ambulatory Visit: Payer: Self-pay | Admitting: Family Medicine

## 2023-08-08 MED ORDER — PRENATAL 27-0.8 MG PO TABS
1.0000 | ORAL_TABLET | Freq: Every day | ORAL | 3 refills | Status: DC
Start: 2023-08-08 — End: 2024-02-18
  Filled 2023-08-08: qty 90, 90d supply, fill #0

## 2023-08-13 ENCOUNTER — Ambulatory Visit (HOSPITAL_BASED_OUTPATIENT_CLINIC_OR_DEPARTMENT_OTHER): Admitting: Obstetrics & Gynecology

## 2023-08-13 ENCOUNTER — Other Ambulatory Visit (INDEPENDENT_AMBULATORY_CARE_PROVIDER_SITE_OTHER): Payer: Self-pay

## 2023-08-13 VITALS — BP 125/89 | HR 108 | Ht 61.0 in | Wt 211.0 lb

## 2023-08-13 DIAGNOSIS — O209 Hemorrhage in early pregnancy, unspecified: Secondary | ICD-10-CM | POA: Diagnosis not present

## 2023-08-13 DIAGNOSIS — Z3A08 8 weeks gestation of pregnancy: Secondary | ICD-10-CM | POA: Diagnosis not present

## 2023-08-13 DIAGNOSIS — Z3A01 Less than 8 weeks gestation of pregnancy: Secondary | ICD-10-CM

## 2023-08-16 ENCOUNTER — Encounter (HOSPITAL_BASED_OUTPATIENT_CLINIC_OR_DEPARTMENT_OTHER): Payer: Self-pay | Admitting: Obstetrics & Gynecology

## 2023-08-16 NOTE — Progress Notes (Signed)
 GYNECOLOGY  VISIT  CC:   bleeding in pregnancy  HPI: 38 y.o. G2P0000 Married [place] or [place] American female here for complaint of bright red spotting that started during the last day.  She called with questions about what to do and she was advised to be seen today.  Was seen in ER 4/28.  Two gestational sacs were seen.  She does have several fibroids with the largest measuring 1.8 x 2.6cm.  Accompanied by her mother today.  Ultrasound showed two gestational sacs now with yolk sacs in each one.  Possible early fetal poles noted.  Small amount of blood present on vaginal ultrasound probe.  Non tender exam.  One ovary well visualized and other was not.  No free fluid.     Past Medical History:  Diagnosis Date   Abnormal uterine bleeding (AUB) 01/13/2022   Allergy    Asthma    Cholelithiasis 05/28/2022   Fibroid    Infrequent menses 2019   Missed abortion 2025   s/p D&E   Neck mass 05/28/2022   Pre-diabetes    Primary hypertension 12/05/2021   STD (sexually transmitted disease)    age 7?   Viral labyrinthitis of both ears 08/15/2022    MEDS:   Current Outpatient Medications on File Prior to Visit  Medication Sig Dispense Refill   metFORMIN  (GLUCOPHAGE ) 500 MG tablet Take 1 tablet in the morning for 7 days then Take 1 tablet (500 mg total) by mouth 2 (two) times daily with a meal. 180 tablet 0   NIFEdipine  (PROCARDIA  XL) 60 MG 24 hr tablet Take 1 tablet (60 mg total) by mouth daily. 30 tablet 7   Prenatal Vit-Fe Fumarate-FA (MULTIVITAMIN-PRENATAL) 27-0.8 MG TABS tablet Take 1 tablet by mouth daily at 12 noon. 90 tablet 3   omeprazole  (PRILOSEC) 20 MG capsule Take 1 capsule (20 mg total) by mouth daily. (Patient not taking: Reported on 06/13/2023) 90 capsule 3   No current facility-administered medications on file prior to visit.    ALLERGIES: Fruit extracts, Kiwi extract, and Latex  SH:  married, non smoker  Review of Systems  Constitutional: Negative.   Genitourinary:  Negative.     PHYSICAL EXAMINATION:    BP 125/89 (Cuff Size: Normal)   Pulse (!) 108   Ht 5\' 1"  (1.549 m)   Wt 211 lb (95.7 kg)   LMP 06/29/2023   BMI 39.87 kg/m     General appearance: alert, cooperative and appears stated age  Pelvic: External genitalia:  no lesions              Urethra:  normal appearing urethra with no masses, tenderness or lesions              Bartholins and Skenes: normal                 Vagina: normal mucosa without prolapse or lesions              Cervix: no lesions               Chaperone, Maudine Sos, RN, was present for exam.  Assessment/Plan: 1. Vaginal bleeding in pregnancy, first trimester (Primary) - US  OB Transvaginal done today showing yolk sacs in each gestational sac - viability scan scheduled for next week so pt will keep this appt - continue metformin  - on PNV

## 2023-08-22 ENCOUNTER — Ambulatory Visit (HOSPITAL_BASED_OUTPATIENT_CLINIC_OR_DEPARTMENT_OTHER)

## 2023-08-22 DIAGNOSIS — O3680X0 Pregnancy with inconclusive fetal viability, not applicable or unspecified: Secondary | ICD-10-CM

## 2023-08-22 DIAGNOSIS — Z3A01 Less than 8 weeks gestation of pregnancy: Secondary | ICD-10-CM | POA: Diagnosis not present

## 2023-08-30 ENCOUNTER — Other Ambulatory Visit: Payer: Self-pay

## 2023-08-30 ENCOUNTER — Inpatient Hospital Stay (HOSPITAL_COMMUNITY)
Admission: AD | Admit: 2023-08-30 | Discharge: 2023-08-30 | Disposition: A | Discharge status: Home / Self Care | Attending: Obstetrics and Gynecology | Admitting: Obstetrics and Gynecology

## 2023-08-30 ENCOUNTER — Encounter (HOSPITAL_COMMUNITY): Payer: Self-pay | Admitting: Obstetrics and Gynecology

## 2023-08-30 ENCOUNTER — Inpatient Hospital Stay (HOSPITAL_COMMUNITY)

## 2023-08-30 DIAGNOSIS — O30041 Twin pregnancy, dichorionic/diamniotic, first trimester: Secondary | ICD-10-CM | POA: Diagnosis not present

## 2023-08-30 DIAGNOSIS — O468X1 Other antepartum hemorrhage, first trimester: Secondary | ICD-10-CM | POA: Diagnosis not present

## 2023-08-30 DIAGNOSIS — Z3A08 8 weeks gestation of pregnancy: Secondary | ICD-10-CM | POA: Diagnosis not present

## 2023-08-30 DIAGNOSIS — O209 Hemorrhage in early pregnancy, unspecified: Secondary | ICD-10-CM

## 2023-08-30 DIAGNOSIS — O418X10 Other specified disorders of amniotic fluid and membranes, first trimester, not applicable or unspecified: Secondary | ICD-10-CM | POA: Diagnosis not present

## 2023-08-30 DIAGNOSIS — N930 Postcoital and contact bleeding: Secondary | ICD-10-CM

## 2023-08-30 DIAGNOSIS — O208 Other hemorrhage in early pregnancy: Secondary | ICD-10-CM | POA: Diagnosis not present

## 2023-08-30 LAB — CBC
HCT: 38.3 % (ref 36.0–46.0)
Hemoglobin: 12.3 g/dL (ref 12.0–15.0)
MCH: 24.6 pg — ABNORMAL LOW (ref 26.0–34.0)
MCHC: 32.1 g/dL (ref 30.0–36.0)
MCV: 76.4 fL — ABNORMAL LOW (ref 80.0–100.0)
Platelets: 500 10*3/uL — ABNORMAL HIGH (ref 150–400)
RBC: 5.01 MIL/uL (ref 3.87–5.11)
RDW: 15.4 % (ref 11.5–15.5)
WBC: 8.9 10*3/uL (ref 4.0–10.5)
nRBC: 0 % (ref 0.0–0.2)

## 2023-08-30 LAB — URINALYSIS, ROUTINE W REFLEX MICROSCOPIC
Bilirubin Urine: NEGATIVE
Glucose, UA: NEGATIVE mg/dL
Ketones, ur: NEGATIVE mg/dL
Leukocytes,Ua: NEGATIVE
Nitrite: NEGATIVE
Protein, ur: NEGATIVE mg/dL
Specific Gravity, Urine: 1.014 (ref 1.005–1.030)
pH: 6 (ref 5.0–8.0)

## 2023-08-30 NOTE — MAU Note (Signed)
 Diamond Thompson is a 38 y.o. at [redacted]w[redacted]d here in MAU reporting: she's having VB since Monday night, passed one small blood clot x1 yesterday.  Denies current pain, had some earlier.  States last intercourse was Monday night.  LMP: 06/29/2023 Onset of complaint: Monday Pain score: 0 Vitals:   08/30/23 1610  BP: 138/86  Pulse: 86  Resp: 18  Temp: 97.9 F (36.6 C)  SpO2: 100%     FHT: NA  Lab orders placed from triage: UA

## 2023-08-30 NOTE — Discharge Instructions (Signed)
 F/U as scheduled with your [facility] Provider

## 2023-08-30 NOTE — Progress Notes (Signed)
 Debbe Fail NP in Family Rm with pt to discuss test results and d/c plan. Written and verbal d/c instructions given by NP and pt then d/c home

## 2023-08-30 NOTE — MAU Provider Note (Signed)
 S Ms. Diamond Thompson is a 38 y.o. G2P0000 patient who presents to MAU today with complaint of VB since Monday night. She reports passing one small blood clot yesterday. Denies any current pain and had recent IC on Monday prior to her episode of vaginal bleeding.   She was  previously seen in the ED at [facility] on 08/06/23 with Judithann Novas IUP visualized and has had 2 additional sonograms since showing progression of pregnancy with embryo visualized in Gestational Sac B  of TIUP  on most recent sono dated 08/22/23  Copied from impression 08/13/23 ultrasound report  (Early twin IUP with two separate gestational sacs and single yolk sac noted within each gestational sac. Appropriate interval change with ultrasound noted)  Copied from 08/22/23 ultrasound report  (Twin pregnancy with gestational Sac A containing abnormally enlarged and thin yolk sac without fetal pole or FCA.  Sac B with normal yolk sac and normal embryo with +FCA and measurements c/w dating by LMP.)  O BP 138/86 (BP Location: Right Arm)   Pulse 86   Temp 97.9 F (36.6 C) (Oral)   Resp 18   Ht 5\' 1"  (1.549 m)   Wt 97.2 kg   LMP 06/29/2023   SpO2 100%   BMI 40.49 kg/m  Physical Exam Vitals and nursing note reviewed.  Constitutional:      General: She is not in acute distress.    Appearance: Normal appearance. She is obese. She is not ill-appearing.  HENT:     Head: Normocephalic.     Nose: Nose normal.     Mouth/Throat:     Mouth: Mucous membranes are moist.  Cardiovascular:     Rate and Rhythm: Normal rate.  Pulmonary:     Effort: Pulmonary effort is normal.  Abdominal:     Palpations: Abdomen is soft.  Musculoskeletal:        General: Normal range of motion.     Cervical back: Normal range of motion.  Skin:    General: Skin is warm.  Neurological:     Mental Status: She is alert and oriented to person, place, and time.  Psychiatric:        Mood and Affect: Mood normal.        Behavior: Behavior normal.       MDM  HIGH  Vaginal Bleeding in Judithann Novas TIUP  CBC: unremarkable OB Ultrasound ( as stated below)  ABO : O Negative ( Rhogam - patient reported she had not received Rhogam and bleeding has been since 08/06/23 intermittently and declines at this time) Vaginal swabs  and U/A to r/o infection  ( U/A with no evidence of UTI - Swabs  pending at discharge)  Review of Medical Records and all previous ultrasounds performed as stated above pertinent to patient chief complaint  Review of ultrasound images show Fetus A with YS only and NO  FCA , Fetus B shows an embryo with + FCA and c/w dates and a [facility] is also identified - Final Radiology Report to follow    Orders Placed This Encounter  Procedures   Wet prep, genital    Standing Status:   Standing    Number of Occurrences:   1   US  OB Transvaginal    Standing Status:   Standing    Number of Occurrences:   1    Symptom/Reason for Exam:   Vaginal bleeding affecting early pregnancy [[id number]]   Urinalysis, Routine w reflex microscopic -Urine, Clean Catch  Standing Status:   Standing    Number of Occurrences:   1    Specimen Source:   Urine, Clean Catch [76]   CBC    Standing Status:   Standing    Number of Occurrences:   1   Discharge patient Discharge disposition: 01-Home or Self Care; Discharge patient date: 08/30/2023    Standing Status:   Standing    Number of Occurrences:   1    Discharge disposition:   01-Home or Self Care [1]    Discharge patient date:   08/30/2023      Results for orders placed or performed during the hospital encounter of 08/30/23 (from the past 24 hours)  Urinalysis, Routine w reflex microscopic -Urine, Clean Catch     Status: Abnormal   Collection Time: 08/30/23  4:44 PM  Result Value Ref Range   Color, Urine YELLOW YELLOW   APPearance HAZY (A) CLEAR   Specific Gravity, Urine 1.014 1.005 - 1.030   pH 6.0 5.0 - 8.0   Glucose, UA NEGATIVE NEGATIVE mg/dL   Hgb urine dipstick LARGE (A) NEGATIVE    Bilirubin Urine NEGATIVE NEGATIVE   Ketones, ur NEGATIVE NEGATIVE mg/dL   Protein, ur NEGATIVE NEGATIVE mg/dL   Nitrite NEGATIVE NEGATIVE   Leukocytes,Ua NEGATIVE NEGATIVE   RBC / HPF 0-5 0 - 5 RBC/hpf   WBC, UA 0-5 0 - 5 WBC/hpf   Bacteria, UA RARE (A) NONE SEEN   Squamous Epithelial / HPF 6-10 0 - 5 /HPF   Mucus PRESENT   CBC     Status: Abnormal   Collection Time: 08/30/23  5:44 PM  Result Value Ref Range   WBC 8.9 4.0 - 10.5 K/uL   RBC 5.01 3.87 - 5.11 MIL/uL   Hemoglobin 12.3 12.0 - 15.0 g/dL   HCT 84.6 96.2 - 95.2 %   MCV 76.4 (L) 80.0 - 100.0 fL   MCH 24.6 (L) 26.0 - 34.0 pg   MCHC 32.1 30.0 - 36.0 g/dL   RDW 84.1 32.4 - 40.1 %   Platelets 500 (H) 150 - 400 K/uL   nRBC 0.0 0.0 - 0.2 %      I have reviewed the patient chart and performed the physical exam . I have ordered & interpreted the lab results and reviewed and interpreted the Ultrasound images  Medications ordered as stated below.  A/P as described below.  Counseling and education provided and patient agreeable  with plan as described below. Verbalized understanding.    ASSESSMENT Medical screening exam complete  1. Vaginal bleeding affecting early pregnancy (Primary)  2. PCB (post coital bleeding)  3. [redacted] weeks gestation of pregnancy  4. Twin pregnancy, dichorionic/diamniotic, first trimester  5. Subchorionic hematoma in first trimester, single or unspecified fetus     PLAN  Discharge from MAU in stable condition  See AVS for full description of educational information and instructions provided to the patient at time of discharge  List of options for follow-up given ( patient reports she has a NOB visit scheduled with Dr Lesta Rater on 09/12/23)  Warning signs for worsening condition that would warrant emergency follow-up discussed Patient may return to MAU as needed   Cherlynn Cornfield, NP 08/30/2023 7:09 PM

## 2023-08-31 LAB — GC/CHLAMYDIA PROBE AMP (~~LOC~~) NOT AT ARMC
Chlamydia: NEGATIVE
Comment: NEGATIVE
Comment: NORMAL
Neisseria Gonorrhea: NEGATIVE

## 2023-09-13 DIAGNOSIS — O3110X0 Continuing pregnancy after spontaneous abortion of one fetus or more, unspecified trimester, not applicable or unspecified: Secondary | ICD-10-CM | POA: Diagnosis not present

## 2023-09-13 DIAGNOSIS — O2620 Pregnancy care for patient with recurrent pregnancy loss, unspecified trimester: Secondary | ICD-10-CM | POA: Diagnosis not present

## 2023-09-13 DIAGNOSIS — D251 Intramural leiomyoma of uterus: Secondary | ICD-10-CM | POA: Diagnosis not present

## 2023-09-13 DIAGNOSIS — O09521 Supervision of elderly multigravida, first trimester: Secondary | ICD-10-CM | POA: Diagnosis not present

## 2023-09-13 DIAGNOSIS — O021 Missed abortion: Secondary | ICD-10-CM | POA: Diagnosis not present

## 2023-09-16 ENCOUNTER — Other Ambulatory Visit: Payer: Self-pay | Admitting: Obstetrics and Gynecology

## 2023-09-17 ENCOUNTER — Encounter (HOSPITAL_COMMUNITY): Payer: Self-pay | Admitting: Obstetrics and Gynecology

## 2023-09-17 ENCOUNTER — Other Ambulatory Visit (HOSPITAL_COMMUNITY): Payer: Self-pay | Admitting: Obstetrics and Gynecology

## 2023-09-17 ENCOUNTER — Other Ambulatory Visit (HOSPITAL_COMMUNITY): Payer: Self-pay

## 2023-09-17 DIAGNOSIS — O021 Missed abortion: Secondary | ICD-10-CM

## 2023-09-17 MED ORDER — MISOPROSTOL 200 MCG PO TABS
200.0000 ug | ORAL_TABLET | ORAL | 0 refills | Status: DC
  Filled 2023-09-17: qty 1, 1d supply, fill #0

## 2023-09-17 NOTE — Progress Notes (Signed)
 Spoke w/ via phone for pre-op interview--- [name] Lab needs dos----  CBC and T&S per surgeon.       Lab results------ COVID test -----patient states asymptomatic no test needed Arrive at -------1315 NPO after MN NO Solid Food.  Clear liquids from MN until---1215 Pre-Surgery Ensure or G2:  Med rec completed Medications to take morning of surgery -----Nifedipine  Diabetic medication -----  GLP1 agonist last dose: GLP1 instructions:  Patient instructed no nail polish to be worn day of surgery Patient instructed to bring photo id and insurance card day of surgery Patient aware to have Driver (ride ) / caregiver    for 24 hours after surgery - Husband Diamond Thompson or Diamond Thompson. Patient Special Instructions ----- Shower with antibacterial soap. Pre-Op special Instructions -----  Patient verbalized understanding of instructions that were given at this phone interview. Patient denies chest pain, sob, fever, cough at the interview.

## 2023-09-18 ENCOUNTER — Ambulatory Visit (HOSPITAL_COMMUNITY)
Admission: RE | Admit: 2023-09-18 | Discharge: 2023-09-18 | Disposition: A | Discharge status: Home / Self Care | Source: Ambulatory Visit | Attending: Obstetrics and Gynecology | Admitting: Obstetrics and Gynecology

## 2023-09-18 ENCOUNTER — Other Ambulatory Visit (HOSPITAL_COMMUNITY): Payer: Self-pay

## 2023-09-18 ENCOUNTER — Ambulatory Visit (HOSPITAL_COMMUNITY): Payer: Self-pay | Admitting: Anesthesiology

## 2023-09-18 ENCOUNTER — Encounter (HOSPITAL_COMMUNITY)
Admission: RE | Disposition: A | Discharge status: Home / Self Care | Payer: Self-pay | Source: Home / Self Care | Attending: Obstetrics and Gynecology

## 2023-09-18 ENCOUNTER — Ambulatory Visit (HOSPITAL_COMMUNITY)
Admission: RE | Admit: 2023-09-18 | Discharge: 2023-09-18 | Disposition: A | Discharge status: Home / Self Care | Attending: Obstetrics and Gynecology | Admitting: Obstetrics and Gynecology

## 2023-09-18 ENCOUNTER — Other Ambulatory Visit: Payer: Self-pay

## 2023-09-18 ENCOUNTER — Encounter (HOSPITAL_COMMUNITY): Payer: Self-pay | Admitting: Obstetrics and Gynecology

## 2023-09-18 DIAGNOSIS — Z3A00 Weeks of gestation of pregnancy not specified: Secondary | ICD-10-CM

## 2023-09-18 DIAGNOSIS — O021 Missed abortion: Secondary | ICD-10-CM

## 2023-09-18 DIAGNOSIS — O2620 Pregnancy care for patient with recurrent pregnancy loss, unspecified trimester: Secondary | ICD-10-CM | POA: Diagnosis not present

## 2023-09-18 DIAGNOSIS — N96 Recurrent pregnancy loss: Secondary | ICD-10-CM

## 2023-09-18 DIAGNOSIS — I10 Essential (primary) hypertension: Secondary | ICD-10-CM | POA: Insufficient documentation

## 2023-09-18 DIAGNOSIS — O3110X0 Continuing pregnancy after spontaneous abortion of one fetus or more, unspecified trimester, not applicable or unspecified: Secondary | ICD-10-CM | POA: Diagnosis not present

## 2023-09-18 DIAGNOSIS — O034 Incomplete spontaneous abortion without complication: Secondary | ICD-10-CM | POA: Insufficient documentation

## 2023-09-18 HISTORY — PX: OPERATIVE ULTRASOUND: SHX5996

## 2023-09-18 HISTORY — PX: DILATION AND EVACUATION: SHX1459

## 2023-09-18 LAB — TYPE AND SCREEN
ABO/RH(D): O NEG
Antibody Screen: NEGATIVE

## 2023-09-18 LAB — CBC
HCT: 40.1 % (ref 36.0–46.0)
Hemoglobin: 12.7 g/dL (ref 12.0–15.0)
MCH: 24.5 pg — ABNORMAL LOW (ref 26.0–34.0)
MCHC: 31.7 g/dL (ref 30.0–36.0)
MCV: 77.4 fL — ABNORMAL LOW (ref 80.0–100.0)
Platelets: 388 10*3/uL (ref 150–400)
RBC: 5.18 MIL/uL — ABNORMAL HIGH (ref 3.87–5.11)
RDW: 14.7 % (ref 11.5–15.5)
WBC: 5.9 10*3/uL (ref 4.0–10.5)
nRBC: 0 % (ref 0.0–0.2)

## 2023-09-18 SURGERY — DILATION AND EVACUATION, UTERUS
Anesthesia: General | Site: Uterus

## 2023-09-18 MED ORDER — MIDAZOLAM HCL 2 MG/2ML IJ SOLN
INTRAMUSCULAR | Status: AC
  Filled 2023-09-18: qty 2

## 2023-09-18 MED ORDER — LACTATED RINGERS IV SOLN: INTRAVENOUS | Status: DC

## 2023-09-18 MED ORDER — 0.9 % SODIUM CHLORIDE (POUR BTL) OPTIME
TOPICAL | Status: DC | PRN
  Administered 2023-09-18: 1000 mL

## 2023-09-18 MED ORDER — OXYCODONE HCL 5 MG PO TABS: 5.0000 mg | ORAL_TABLET | Freq: Once | ORAL | Status: DC | PRN

## 2023-09-18 MED ORDER — PROPOFOL 10 MG/ML IV BOLUS
INTRAVENOUS | Status: DC | PRN
  Administered 2023-09-18: 50 mg via INTRAVENOUS
  Administered 2023-09-18: 200 mg via INTRAVENOUS
  Administered 2023-09-18: 50 mg via INTRAVENOUS

## 2023-09-18 MED ORDER — MEPERIDINE HCL 25 MG/ML IJ SOLN: 6.2500 mg | INTRAMUSCULAR | Status: DC | PRN

## 2023-09-18 MED ORDER — CHLOROPROCAINE HCL 1 % IJ SOLN
INTRAMUSCULAR | Status: AC
  Filled 2023-09-18: qty 30

## 2023-09-18 MED ORDER — ONDANSETRON HCL 4 MG/2ML IJ SOLN
4.0000 mg | Freq: Once | INTRAMUSCULAR | Status: DC | PRN
Start: 2023-09-18 — End: 2023-09-18

## 2023-09-18 MED ORDER — TRANEXAMIC ACID-NACL 1000-0.7 MG/100ML-% IV SOLN
INTRAVENOUS | Status: AC
  Filled 2023-09-18: qty 100

## 2023-09-18 MED ORDER — CELECOXIB 200 MG PO CAPS: 200.0000 mg | ORAL_CAPSULE | Freq: Once | ORAL | Status: AC

## 2023-09-18 MED ORDER — CHLORHEXIDINE GLUCONATE 0.12 % MT SOLN: 15.0000 mL | Freq: Once | OROMUCOSAL | Status: AC

## 2023-09-18 MED ORDER — CHLORHEXIDINE GLUCONATE 0.12 % MT SOLN
OROMUCOSAL | Status: AC
  Administered 2023-09-18: 15 mL via OROMUCOSAL
  Filled 2023-09-18: qty 15

## 2023-09-18 MED ORDER — ONDANSETRON HCL 4 MG/2ML IJ SOLN
INTRAMUSCULAR | Status: AC
  Filled 2023-09-18: qty 2

## 2023-09-18 MED ORDER — ACETAMINOPHEN 500 MG PO TABS: 1000.0000 mg | ORAL_TABLET | Freq: Once | ORAL | Status: AC

## 2023-09-18 MED ORDER — CHLOROPROCAINE HCL 1 % IJ SOLN
INTRAMUSCULAR | Status: DC | PRN
  Administered 2023-09-18: 20 mL

## 2023-09-18 MED ORDER — MIDAZOLAM HCL 2 MG/2ML IJ SOLN
INTRAMUSCULAR | Status: DC | PRN
  Administered 2023-09-18: 2 mg via INTRAVENOUS

## 2023-09-18 MED ORDER — SODIUM CHLORIDE 0.9 % IV SOLN
INTRAVENOUS | Status: DC | PRN
  Administered 2023-09-18: 100 mg via INTRAVENOUS

## 2023-09-18 MED ORDER — FENTANYL CITRATE (PF) 100 MCG/2ML IJ SOLN: 25.0000 ug | INTRAMUSCULAR | Status: DC | PRN

## 2023-09-18 MED ORDER — METHYLERGONOVINE MALEATE 0.2 MG/ML IJ SOLN
INTRAMUSCULAR | Status: DC | PRN
  Administered 2023-09-18: .2 mg via INTRAMUSCULAR

## 2023-09-18 MED ORDER — METHYLERGONOVINE MALEATE 0.2 MG/ML IJ SOLN
INTRAMUSCULAR | Status: AC
  Filled 2023-09-18: qty 1

## 2023-09-18 MED ORDER — IBUPROFEN 800 MG PO TABS
800.0000 mg | ORAL_TABLET | Freq: Three times a day (TID) | ORAL | 3 refills | Status: DC | PRN
  Filled 2023-09-18: qty 30, 10d supply, fill #0

## 2023-09-18 MED ORDER — LIDOCAINE 2% (20 MG/ML) 5 ML SYRINGE
INTRAMUSCULAR | Status: DC | PRN
  Administered 2023-09-18: 60 mg via INTRAVENOUS

## 2023-09-18 MED ORDER — GLYCOPYRROLATE 0.2 MG/ML IJ SOLN
INTRAMUSCULAR | Status: DC | PRN
Start: 2023-09-18 — End: 2023-09-18
  Administered 2023-09-18: .2 mg via INTRAVENOUS

## 2023-09-18 MED ORDER — FENTANYL CITRATE (PF) 250 MCG/5ML IJ SOLN
INTRAMUSCULAR | Status: DC | PRN
  Administered 2023-09-18 (×3): 50 ug via INTRAVENOUS
  Administered 2023-09-18: 100 ug via INTRAVENOUS

## 2023-09-18 MED ORDER — SODIUM CHLORIDE 0.9 % IV SOLN
100.0000 mg | Freq: Once | INTRAVENOUS | Status: DC
  Filled 2023-09-18: qty 100

## 2023-09-18 MED ORDER — ONDANSETRON HCL 4 MG/2ML IJ SOLN
INTRAMUSCULAR | Status: DC | PRN
  Administered 2023-09-18: 4 mg via INTRAVENOUS

## 2023-09-18 MED ORDER — ACETAMINOPHEN 500 MG PO TABS
ORAL_TABLET | ORAL | Status: AC
  Administered 2023-09-18: 1000 mg via ORAL
  Filled 2023-09-18: qty 2

## 2023-09-18 MED ORDER — POVIDONE-IODINE 10 % EX SWAB: 2.0000 | Freq: Once | CUTANEOUS | Status: DC

## 2023-09-18 MED ORDER — MISOPROSTOL 200 MCG PO TABS
ORAL_TABLET | ORAL | Status: AC
  Filled 2023-09-18: qty 5

## 2023-09-18 MED ORDER — DEXAMETHASONE SODIUM PHOSPHATE 10 MG/ML IJ SOLN
INTRAMUSCULAR | Status: DC | PRN
  Administered 2023-09-18: 10 mg via INTRAVENOUS

## 2023-09-18 MED ORDER — CARBOPROST TROMETHAMINE 250 MCG/ML IM SOLN
INTRAMUSCULAR | Status: AC
  Filled 2023-09-18: qty 1

## 2023-09-18 MED ORDER — ORAL CARE MOUTH RINSE: 15.0000 mL | Freq: Once | OROMUCOSAL | Status: AC

## 2023-09-18 MED ORDER — PROPOFOL 10 MG/ML IV BOLUS
INTRAVENOUS | Status: AC
  Filled 2023-09-18: qty 20

## 2023-09-18 MED ORDER — CELECOXIB 200 MG PO CAPS
ORAL_CAPSULE | ORAL | Status: AC
  Administered 2023-09-18: 200 mg via ORAL
  Filled 2023-09-18: qty 1

## 2023-09-18 MED ORDER — TRANEXAMIC ACID 1000 MG/10ML IV SOLN
INTRAVENOUS | Status: DC | PRN
  Administered 2023-09-18: 1000 mg via INTRAVENOUS

## 2023-09-18 MED ORDER — GLYCOPYRROLATE PF 0.2 MG/ML IJ SOSY
PREFILLED_SYRINGE | INTRAMUSCULAR | Status: AC
  Filled 2023-09-18: qty 1

## 2023-09-18 MED ORDER — FENTANYL CITRATE (PF) 250 MCG/5ML IJ SOLN
INTRAMUSCULAR | Status: AC
  Filled 2023-09-18: qty 5

## 2023-09-18 MED ORDER — OXYCODONE HCL 5 MG/5ML PO SOLN: 5.0000 mg | Freq: Once | ORAL | Status: DC | PRN

## 2023-09-18 SURGICAL SUPPLY — 21 items
CATH ROBINSON RED A/P 16FR (CATHETERS) ×2 IMPLANT
COVER MAYO STAND STRL (DRAPES) ×2 IMPLANT
FILTER UTR ASPR ASSEMBLY (MISCELLANEOUS) ×2 IMPLANT
GAUZE 4X4 16PLY ~~LOC~~+RFID DBL (SPONGE) IMPLANT
GLOVE BIOGEL PI IND STRL 7.0 (GLOVE) ×4 IMPLANT
GLOVE ECLIPSE 6.5 STRL STRAW (GLOVE) ×2 IMPLANT
GOWN STRL REUS W/ TWL LRG LVL3 (GOWN DISPOSABLE) ×4 IMPLANT
HOSE CONNECTING 18IN BERKELEY (TUBING) ×2 IMPLANT
KIT BERKELEY 1ST TRI 3/8 NO TR (MISCELLANEOUS) ×2 IMPLANT
KIT BERKELEY 1ST TRIMESTER 3/8 (MISCELLANEOUS) ×2 IMPLANT
NS IRRIG 1000ML POUR BTL (IV SOLUTION) ×2 IMPLANT
PACK VAGINAL MINOR WOMEN LF (CUSTOM PROCEDURE TRAY) ×2 IMPLANT
PAD OB MATERNITY 11 LF (PERSONAL CARE ITEMS) ×2 IMPLANT
SET BERKELEY SUCTION TUBING (SUCTIONS) ×2 IMPLANT
SPIKE FLUID TRANSFER (MISCELLANEOUS) ×2 IMPLANT
TOWEL GREEN STERILE FF (TOWEL DISPOSABLE) ×4 IMPLANT
UNDERPAD 30X36 HEAVY ABSORB (UNDERPADS AND DIAPERS) ×2 IMPLANT
VACURETTE 10 RIGID CVD (CANNULA) IMPLANT
VACURETTE 7MM CVD STRL WRAP (CANNULA) IMPLANT
VACURETTE 8 RIGID CVD (CANNULA) IMPLANT
VACURETTE 9 RIGID CVD (CANNULA) IMPLANT

## 2023-09-18 NOTE — Interval H&P Note (Signed)
 History and Physical Interval Note:  09/18/2023 1:38 PM  Diamond Thompson  has presented today for surgery, with the diagnosis of missed Abortion.  The various methods of treatment have been discussed with the patient and family. After consideration of risks, benefits and other options for treatment, the patient has consented to  Procedure(s): DILATION AND EVACUATION, UTERUS (N/A) US  INTRAOPERATIVE (N/A) as a surgical intervention.  The patient's history has been reviewed, patient examined, no change in status, stable for surgery.  I have reviewed the patient's chart and labs.  Questions were answered to the patient's satisfaction.     Kalanie Fewell A Semir Brill

## 2023-09-18 NOTE — Anesthesia Procedure Notes (Signed)
 Procedure Name: LMA Insertion Date/Time: 09/18/2023 2:56 PM  Performed by: Mabel Savage, CRNAPre-anesthesia Checklist: Patient identified, Emergency Drugs available, Suction available, Patient being monitored and Timeout performed Patient Re-evaluated:Patient Re-evaluated prior to induction Oxygen Delivery Method: Circle system utilized Preoxygenation: Pre-oxygenation with 100% oxygen Induction Type: IV induction Ventilation: Mask ventilation without difficulty LMA: LMA inserted LMA Size: 4.0 Number of attempts: 1

## 2023-09-18 NOTE — Anesthesia Postprocedure Evaluation (Signed)
 Anesthesia Post Note  Patient: Teasia Zapf  Procedure(s) Performed: DILATION AND EVACUATION, UTERUS (Uterus) US  INTRAOPERATIVE (Abdomen)     Patient location during evaluation: PACU Anesthesia Type: General Level of consciousness: awake and alert Pain management: pain level controlled Vital Signs Assessment: post-procedure vital signs reviewed and stable Respiratory status: spontaneous breathing, nonlabored ventilation, respiratory function stable and patient connected to nasal cannula oxygen Cardiovascular status: blood pressure returned to baseline and stable Postop Assessment: no apparent nausea or vomiting Anesthetic complications: no   No notable events documented.  Last Vitals:  Vitals:   09/18/23 1615 09/18/23 1630  BP: (!) 151/82 (!) 148/90  Pulse: 74 77  Resp: 18 15  Temp: 37 C   SpO2: 100% 100%    Last Pain:  Vitals:   09/18/23 1600  TempSrc:   PainSc: 0-No pain                 Cristopher Ciccarelli

## 2023-09-18 NOTE — Brief Op Note (Signed)
 09/18/2023  4:12 PM  PATIENT:  Diamond Thompson  38 y.o. female  PRE-OPERATIVE DIAGNOSIS:  missed Abortion, recurrent pregnancy loss, vanishing twin syndrome  POST-OPERATIVE DIAGNOSIS:  incomplete spontaneous abortion, recurrent pregnancy loss, vanishing twin syndrome  PROCEDURE:  ultrasound guided suction dilation and evacuation  SURGEON:  Surgeons and Role:    * Ivery Marking, MD - Primary  PHYSICIAN ASSISTANT:   ASSISTANTS: none   ANESTHESIA:   general, paracervical block  EBL:  1 mL   BLOOD ADMINISTERED:none  DRAINS: none   LOCAL MEDICATIONS USED:  OTHER nesicaine  SPECIMEN:  Source of Specimen:  POC  DISPOSITION OF SPECIMEN:  PATHOLOGY  COUNTS:  YES  TOURNIQUET:  * No tourniquets in log *  DICTATION: .Other Dictation: Dictation Number [id number]  PLAN OF CARE: Discharge to home after PACU  PATIENT DISPOSITION:  PACU - hemodynamically stable.   Delay start of Pharmacological VTE agent (>24hrs) due to surgical blood loss or risk of bleeding: no

## 2023-09-18 NOTE — H&P (Signed)
 Diamond Thompson is an 38 y.o. female. G2P0010 MBF presents for surgical mgmt of Missed abortion. Pt was initially dx with twin gestation with 2nd sac empty. Pt has been having ongoing vaginal bleeding. Pt presented  for new OB care ( transferring from prior practice)and sono done last Thursday showed IUP at 9 + wk with no FHR, second sac smaller,. Previous twin gestation pregnancy loss   Pertinent Gynecological History: Menses: regular every month without intermenstrual spotting Bleeding: threatened ab Contraception: none DES exposure: denies Blood transfusions: none Sexually transmitted diseases: no past history Previous GYN Procedures: DNC  Last mammogram: n/a Date: n/a Last pap: normal Date: 2024 OB History: G2, P0010   Menstrual History: Menarche age: n/a [name] was 06/29/2023 (approximate).    Past Medical History:  Diagnosis Date   Abnormal uterine bleeding (AUB) 01/13/2022   Allergy    Asthma    Cholelithiasis 05/28/2022   Fibroid    Infrequent menses 2019   Missed abortion 2025   s/p D&E   Neck mass 05/28/2022   Pre-diabetes    Primary hypertension 12/05/2021   STD (sexually transmitted disease)    age 59?   Viral labyrinthitis of both ears 08/15/2022    Past Surgical History:  Procedure Laterality Date   CHOLECYSTECTOMY N/A 09/06/2022   Procedure: LAPAROSCOPIC CHOLECYSTECTOMY;  Surgeon: Lujean Sake, MD;  Location: [facility] OR;  Service: General;  Laterality: N/A;   DILATION AND EVACUATION N/A 02/28/2023   Procedure: DILATATION AND EVACUATION;  Surgeon: Verlyn Goad, MD;  Location: [facility] OR;  Service: Gynecology;  Laterality: N/A;    Family History  Problem Relation Age of Onset   Hypertension Mother    Hypertension Father    Hypertension Maternal Grandmother    Hypertension Paternal Grandmother    Diabetes Paternal Grandmother     Social History:  reports that she has never smoked. She has never been exposed to tobacco smoke.  She has never used smokeless tobacco. She reports that she does not currently use alcohol. She reports that she does not currently use drugs.  Allergies:  Allergies  Allergen Reactions   Fruit Extracts Itching and Swelling    Raw fruit and vegetables.  Throat and tongue itching   Kiwi Extract Itching and Swelling    Raw fruits and veggies Throat and tongue itching   Latex Itching    No medications prior to admission.    Review of Systems  All other systems reviewed and are negative.   Height 5\' 1"  (1.549 m), weight 97.5 kg, last menstrual period 06/29/2023, not currently breastfeeding. Physical Exam Constitutional:      Appearance: Normal appearance. She is obese.  HENT:     Head: Atraumatic.  Eyes:     Extraocular Movements: Extraocular movements intact.  Cardiovascular:     Heart sounds: Normal heart sounds.  Pulmonary:     Breath sounds: Normal breath sounds.  Abdominal:     Palpations: Abdomen is soft.  Genitourinary:    General: Normal vulva.     Comments: Vagina: dark blood Cervix closed Uterus 10 wk av Adnexa nl Musculoskeletal:        General: Normal range of motion.     Cervical back: Neck supple.  Skin:    General: Skin is warm and dry.  Neurological:     General: No focal deficit present.     Mental Status: She is alert and oriented to person, place, and time.     No results found  for this or any previous visit (from the past 24 hours).  No results found.  Assessment/Plan: Missed abortion Vanishing twin syndrome' Recurrent pregnancy loss with current pregnancy P) u/s guided suction Dilation and evacuation with anora testing Procedure explained. Risk of surgery reviewed including infection, bleeding, injury to surrounding organ structure, uterine perforation( 04/998) and its risk, retained tissue, internal scar tissue. Possible need for blood transfusion and its risk( HIV, acute rxn, fever/rash). All ? answered  Diamond Thompson A Diamond Thompson 09/18/2023,  4:47 AM

## 2023-09-18 NOTE — Anesthesia Preprocedure Evaluation (Addendum)
 Anesthesia Evaluation  Patient identified by MRN, date of birth, ID band Patient awake    Reviewed: Allergy & Precautions, NPO status , Patient's Chart, lab work & pertinent test results  History of Anesthesia Complications Negative for: history of anesthetic complications  Airway Mallampati: I  TM Distance: >3 FB Neck ROM: Full    Dental  (+) Missing,    Pulmonary asthma    Pulmonary exam normal        Cardiovascular hypertension, Pt. on medications Normal cardiovascular exam     Neuro/Psych negative neurological ROS     GI/Hepatic Neg liver ROS,GERD  Medicated,,  Endo/Other    Renal/GU negative Renal ROS     Musculoskeletal negative musculoskeletal ROS (+)    Abdominal   Peds  Hematology negative hematology ROS (+)   Anesthesia Other Findings Day of surgery medications reviewed with patient.  Reproductive/Obstetrics                             Anesthesia Physical Anesthesia Plan  ASA: 2  Anesthesia Plan: General   Post-op Pain Management: Tylenol  PO (pre-op)* and Toradol  IV (intra-op)*   Induction: Intravenous  PONV Risk Score and Plan: 3 and Midazolam , Treatment may vary due to age or medical condition, Dexamethasone  and Ondansetron   Airway Management Planned: LMA  Additional Equipment: None  Intra-op Plan:   Post-operative Plan: Extubation in OR  Informed Consent: I have reviewed the patients History and Physical, chart, labs and discussed the procedure including the risks, benefits and alternatives for the proposed anesthesia with the patient or authorized representative who has indicated his/her understanding and acceptance.     Dental advisory given  Plan Discussed with: CRNA and Anesthesiologist  Anesthesia Plan Comments:        Anesthesia Quick Evaluation

## 2023-09-18 NOTE — Discharge Instructions (Addendum)
 CALL  IF TEMP>100.4, NOTHING PER VAGINA X 2 WK, CALL IF SOAKING A MAXI  PAD EVERY HOUR OR MORE FREQUENTLY   Post Anesthesia Home Care Instructions  Activity: Get plenty of rest for the remainder of the day. A responsible individual must stay with you for 24 hours following the procedure.  For the next 24 hours, DO NOT: -Drive a car -Advertising copywriter -Drink alcoholic beverages -Take any medication unless instructed by your physician -Make any legal decisions or sign important papers.  Meals: Start with liquid foods such as gelatin or soup. Progress to regular foods as tolerated. Avoid greasy, spicy, heavy foods. If nausea and/or vomiting occur, drink only clear liquids until the nausea and/or vomiting subsides. Call your physician if vomiting continues.  Special Instructions/Symptoms: Your throat may feel dry or sore from the anesthesia or the breathing tube placed in your throat during surgery. If this causes discomfort, gargle with warm salt water. The discomfort should disappear within 24 hours.       [facility] Home care Instructions:   Personal hygiene:  Used sanitary napkins for vaginal drainage not tampons. Shower or tub bathe the day after your procedure. No douching until bleeding stops. Always wipe from front to back after  Elimination.  Activity: Do not drive or operate any equipment today. The effects of the anesthesia are still present and drowsiness may result. Rest today, not necessarily flat bed rest, just take it easy. You may resume your normal activity in one to 2 days.  Sexual activity: No intercourse for one week or as indicated by your physician  Diet: Eat a light diet as desired this evening. You may resume a regular diet tomorrow.  General Expectations of your surgery: Vaginal bleeding should be no heavier than a normal period. Spotting may continue up to 10 days. Mild cramps may continue for a couple of days. You may have a regular period in 2-6  weeks.  Unexpected observations call your doctor if these occur: persistent or heavy bleeding. Severe abdominal cramping or pain. Elevation of temperature greater than 100F.  Tylenol  given at 1:52 PM before surgery. May take next dose at 8 PM as needed for soreness/cramping.

## 2023-09-18 NOTE — Transfer of Care (Signed)
 Immediate Anesthesia Transfer of Care Note  Patient: Diamond Thompson  Procedure(s) Performed: DILATION AND EVACUATION, UTERUS (Uterus) US  INTRAOPERATIVE (Abdomen)  Patient Location: PACU  Anesthesia Type:General  Level of Consciousness: awake and alert   Airway & Oxygen Therapy: Patient Spontanous Breathing and Patient connected to nasal cannula oxygen  Post-op Assessment: Report given to RN and Post -op Vital signs reviewed and stable  Post vital signs: stable  Last Vitals:  Vitals Value Taken Time  BP 139/88 09/18/23 1539  Temp    Pulse 87 09/18/23 1540  Resp 19 09/18/23 1541  SpO2 98 % 09/18/23 1540  Vitals shown include unfiled device data.  Last Pain:  Vitals:   09/18/23 1404  TempSrc: Oral  PainSc: 0-No pain         Complications: No notable events documented.

## 2023-09-19 ENCOUNTER — Encounter (HOSPITAL_COMMUNITY): Payer: Self-pay | Admitting: Obstetrics and Gynecology

## 2023-09-19 NOTE — Op Note (Signed)
 NAME: Diamond Thompson, Diamond Thompson MEDICAL RECORD NO: [id number] ACCOUNT NO: [id number] DATE OF BIRTH: [birth year] FACILITY: [facility] LOCATION: [facility] PHYSICIAN: Lonnetta Kniskern A. Lesta Rater, MD  Operative Report   DATE OF PROCEDURE: 09/18/2023  PREOPERATIVE DIAGNOSES: 1. Missed abortion. 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  PROCEDURE: Ultrasound-guided suction dilation and evacuation with chromosome analysis.  POSTOPERATIVE DIAGNOSES: 1. Incomplete spontaneous abortion. 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  ANESTHESIA: General, paracervical block.  SURGEON: Ivery Marking, MD.  ASSISTANT: None.  DESCRIPTION OF PROCEDURE: Under adequate general anesthesia, the patient was placed in the dorsal lithotomy position. She was sterilely prepped and draped in the usual fashion. The bladder was not catheterized in anticipation of ultrasound guidance. A  bi-valve speculum was placed in the vagina. A large amount of blood was noted in the vagina. The cervix was grasped with a single-tooth tenaculum. The cervix was easily accepted, a #21 Pratt dilator, there was what appeared to be amniotic-type sac at the  os. Nonetheless, the #10 mm curved suction cannula was introduced into the uterus under ultrasound guidance. In the ultrasound process, there was no fetus noted. A lot of tissue was noted. The cavity was suctioned and  curetted and gently suctioned and curetted until all tissue was felt to be removed. The #8 mm curved suction cannula was introduced as well. Intraoperatively, the uterus appeared to be boggy with some gushing. Therefore, intravenous tranexamic acid  was given as was intramuscular Methergine. The bladder was then subsequently catheterized and uterus was bimanually massaged with good firmness and resolution of any extra bleeding was noted.  SPECIMEN: Products of conception sent to Pathology. A portion of the tissue went to [facility] for genetic  testing.  ESTIMATED BLOOD LOSS: 50 mL.  COMPLICATIONS: None.  DISPOSITION: The patient tolerated the procedure well and was transferred to the recovery room in stable condition.    SUJ D: 09/18/2023 4:19:57 pm T: 09/19/2023 12:22:00 am  JOB: [id number]/ [id number]

## 2023-09-20 LAB — SURGICAL PATHOLOGY

## 2023-09-27 ENCOUNTER — Other Ambulatory Visit (HOSPITAL_COMMUNITY): Payer: Self-pay

## 2023-10-02 LAB — ANORA MISCARRIAGE TEST - FRESH

## 2023-10-10 LAB — ANORA MISCARRIAGE TEST - FRESH

## 2023-11-12 ENCOUNTER — Other Ambulatory Visit (HOSPITAL_COMMUNITY): Payer: Self-pay

## 2023-11-21 ENCOUNTER — Other Ambulatory Visit (HOSPITAL_COMMUNITY): Payer: Self-pay

## 2024-01-08 ENCOUNTER — Other Ambulatory Visit (HOSPITAL_BASED_OUTPATIENT_CLINIC_OR_DEPARTMENT_OTHER): Payer: Self-pay | Admitting: Obstetrics & Gynecology

## 2024-01-08 ENCOUNTER — Other Ambulatory Visit (HOSPITAL_COMMUNITY): Payer: Self-pay

## 2024-01-08 DIAGNOSIS — N915 Oligomenorrhea, unspecified: Secondary | ICD-10-CM | POA: Diagnosis not present

## 2024-01-09 ENCOUNTER — Other Ambulatory Visit: Payer: Self-pay

## 2024-01-14 ENCOUNTER — Other Ambulatory Visit (HOSPITAL_COMMUNITY): Payer: Self-pay

## 2024-01-14 MED ORDER — METFORMIN HCL 500 MG PO TABS
500.0000 mg | ORAL_TABLET | Freq: Two times a day (BID) | ORAL | 11 refills | Status: AC
  Filled 2024-01-14: qty 60, 30d supply, fill #0
  Filled 2024-02-20 – 2024-03-10 (×2): qty 60, 30d supply, fill #1

## 2024-01-21 ENCOUNTER — Other Ambulatory Visit (HOSPITAL_COMMUNITY): Payer: Self-pay

## 2024-01-28 ENCOUNTER — Other Ambulatory Visit (HOSPITAL_COMMUNITY): Payer: Self-pay

## 2024-02-07 ENCOUNTER — Other Ambulatory Visit (HOSPITAL_COMMUNITY): Payer: Self-pay

## 2024-02-17 NOTE — Progress Notes (Unsigned)
 [address] [facility], [place], [place] [address] Office: [phone]  /  Fax: [phone]   Initial Consultation    Diamond Thompson was seen in clinic today to evaluate for obesity. She is interested in losing weight to improve overall health and reduce the risk of weight related complications. She presents today to review program treatment options, initial physical assessment, and evaluation.    Anthropometrics and Bioimpedance Analysis   There is no height or weight on file to calculate BMI. Body Fat Mass : *** % Visceral Fat Mass Rating : *** Waist to Height Ratio: ***  Obesity Related Diseases and Complications  Obesity Quality of Life and Psychosocial Complications: {emqolpsychosoc:33006::Reduced health-related quality of life}  Cardiometabolic: {emcardiometabolic complications:33007}  Biomechanical: {embiomechanical:33008}   Weight Related History  She was referred by: {emreferby:28303}  When asked what they would like to accomplish? She states: {EMHopetoaccomplish:28304::Adopt a healthier eating pattern and lifestyle,Improve energy levels and physical activity,Improve existing medical conditions,Improve quality of life}  Weight history: ***  Highest weight: ***  Contributing factors: {EMcontributingfactors:28307}  Prior weight loss attempts: {emweightlossprograms:31590::None}  Current or previous pharmacotherapy: {EM previousRx:28311}  Response to medication: {EMResponsetomedication:28312}  Current nutrition plan: {EMNutritionplan:28309::None}  Greatest challenge with dieting: {emgreatestchallengediet:31593}.  Current level of physical activity: {EMcurrentPA:28310::None}  Barriers to Exercise: {embarrierstoexercise:32606::no barriers}  Readiness and Motivation  On a scale from 0 to 10 How ready are you to make changes to your eating and physical activity to lose weight? {NUMBER 1-10:22536} How important is it for you to lose  weight right now ? {NUMBER 1-10:22536} How confident are you that you can lose weight if you try? {NUMBER [id number]  Past Medical History   Past Medical History:  Diagnosis Date   Abnormal uterine bleeding (AUB) 01/13/2022   Allergy    Asthma    Cholelithiasis 05/28/2022   Fibroid    Infrequent menses 2019   Missed abortion 2025   s/p D&E   Neck mass 05/28/2022   Pre-diabetes    Primary hypertension 12/05/2021   STD (sexually transmitted disease)    age 38?   Viral labyrinthitis of both ears 08/15/2022     Objective    LMP 06/29/2023  She was weighed on the bioimpedance scale: There is no height or weight on file to calculate BMI.    General:  Alert, oriented and cooperative. Patient is in no acute distress.  Respiratory: Normal respiratory effort, no problems with respiration noted   Gait: able to ambulate independently  Mental Status: Normal mood and affect. Normal behavior. Normal judgment and thought content.   Diagnostic Data Reviewed  BMET    Component Value Date/Time   NA 133 (L) 02/28/2023 1135   K 3.0 (L) 02/28/2023 1135   CL 105 02/28/2023 1135   CO2 20 (L) 02/28/2023 1135   GLUCOSE 88 02/28/2023 1135   BUN 5 (L) 02/28/2023 1135   CREATININE 0.82 02/28/2023 1135   CALCIUM 8.7 (L) 02/28/2023 1135   GFRNONAA >60 02/28/2023 1135   Lab Results  Component Value Date   HGBA1C 6.2 05/23/2022   HGBA1C 6.3 01/10/2022   Lab Results  Component Value Date   INSULIN  31.5 (H) 08/25/2022   CBC    Component Value Date/Time   WBC 5.9 09/18/2023 1358   RBC 5.18 (H) 09/18/2023 1358   HGB 12.7 09/18/2023 1358   HGB 11.6 10/31/2018 1016   HCT 40.1 09/18/2023 1358   HCT 37.5 10/31/2018 1016   PLT 388 09/18/2023 1358   PLT 460 (H)  10/31/2018 1016   MCV 77.4 (L) 09/18/2023 1358   MCV 80 10/31/2018 1016   MCH 24.5 (L) 09/18/2023 1358   MCHC 31.7 09/18/2023 1358   RDW 14.7 09/18/2023 1358   RDW 15.0 10/31/2018 1016   Iron/TIBC/Ferritin/ %Sat No results  found for: IRON, TIBC, FERRITIN, IRONPCTSAT Lipid Panel     Component Value Date/Time   CHOL 162 05/23/2022 1418   TRIG 99.0 05/23/2022 1418   HDL 41.70 05/23/2022 1418   CHOLHDL 4 05/23/2022 1418   VLDL 19.8 05/23/2022 1418   LDLCALC 101 (H) 05/23/2022 1418   Hepatic Function Panel     Component Value Date/Time   PROT 7.4 06/05/2022 0924   ALBUMIN 4.0 06/05/2022 0924   AST 17 06/05/2022 0924   ALT 16 06/05/2022 0924   ALKPHOS 109 06/05/2022 0924   BILITOT 0.4 06/05/2022 0924      Component Value Date/Time   TSH 2.25 05/23/2022 1418    Medications  Outpatient Encounter Medications as of 02/18/2024  Medication Sig   ibuprofen  (ADVIL ) 800 MG tablet Take 1 tablet (800 mg total) by mouth every 8 (eight) hours as needed.   metFORMIN  (GLUCOPHAGE ) 500 MG tablet Take 1 tablet in the morning for 7 days then Take 1 tablet (500 mg total) by mouth 2 (two) times daily with a meal. (Patient taking differently: Take 500 mg by mouth daily with breakfast.)   metFORMIN  (GLUCOPHAGE ) 500 MG tablet Take 1 tablet (500 mg total) by mouth 2 (two) times daily.   NIFEdipine  (PROCARDIA  XL) 60 MG 24 hr tablet Take 1 tablet (60 mg total) by mouth daily. (Patient taking differently: Take 60 mg by mouth daily as needed (Heartburn).)   omeprazole  (PRILOSEC) 20 MG capsule Take 1 capsule (20 mg total) by mouth daily.   Prenatal Vit-Fe Fumarate-FA (MULTIVITAMIN-PRENATAL) 27-0.8 MG TABS tablet Take 1 tablet by mouth daily at 12 noon.   No facility-administered encounter medications on file as of 02/18/2024.     Assessment and Plan   Primary hypertension  Prediabetes  PCOS (polycystic ovarian syndrome)  Gastroesophageal reflux disease, unspecified whether esophagitis present  Biliary calculus of other site without obstruction       Obesity Treatment and Action Plan:  {EMobesityactionplanscribe:28314::Patient will work on garnering support from family and friends to begin weight loss  journey.,Will work on eliminating or reducing the presence of highly palatable, calorie dense foods in the home.,Will complete provided nutritional and psychosocial assessment questionnaire before the next appointment.,Will be scheduled for indirect calorimetry to determine resting energy expenditure in a fasting state.  This will allow us  to create a reduced calorie, high-protein meal plan to promote loss of fat mass while preserving muscle mass.,Counseled on the health benefits of losing 5%-15% of total body weight.,Was counseled on nutritional approaches to weight loss and benefits of reducing processed foods and consuming plant-based foods and high quality protein as part of nutritional weight management.,Was counseled on pharmacotherapy and role as an adjunct in weight management. }  Education and Additional resources  She was weighed on the bioimpedance scale and results were discussed and documented in the synopsis.  We discussed obesity as a progressive, chronic disease and the importance of a more detailed evaluation of all the factors contributing to the disease.  We reviewed the basic principles in obesity management.   We discussed the importance of long term lifestyle changes which include nutrition, exercise and behavioral modification as well as the importance of customizing this to her specific health and social needs.  We reviewed the role of medical interventions including pharmacotherapy and surgical interventions.   We discussed the benefits of reaching a healthier weight to alleviate the symptoms of existing conditions and reduce the risks of the biomechanical, cardiometabolic and psychological effects of obesity.  We reviewed our program approach and philosophy, which are guided by the four pillars of obesity medicine.  We discussed how to prepare for intake appointment and the importance of fasting and avoidance of stimulants for at least 8 hours prior to  indirect calorimetry.  Charleen LOISE Shlomo Landy appears to be in the action stage of change and reports being ready to initiate intensive lifestyle and behavioral modifications as part of their weight loss journey.  Attestation  Reviewed by clinician on day of visit: allergies, medications, problem list, medical history, surgical history, family history, social history, and previous encounter notes pertinent to obesity diagnosis.  I have spent *** minutes in the care of the patient today including: {NUMBER 1-10:22536} minutes before the visit reviewing and preparing the chart. *** minutes face-to-face {emfacetoface:32598::assessing and reviewing listed medical problems as outlined in obesity care plan,providing nutritional and behavioral counseling on topics outlined in the obesity care plan,independently interpreting test results and goals of care, as described in assessment and plan,reviewing and discussing biometric information and progress} {NUMBER 1-10:22536} minutes after the visit updating chart and documentation of encounter.  Luellen Howson,PA-C

## 2024-02-18 ENCOUNTER — Encounter (INDEPENDENT_AMBULATORY_CARE_PROVIDER_SITE_OTHER): Payer: Self-pay | Admitting: Physician Assistant

## 2024-02-18 ENCOUNTER — Ambulatory Visit (INDEPENDENT_AMBULATORY_CARE_PROVIDER_SITE_OTHER): Admitting: Physician Assistant

## 2024-02-18 VITALS — BP 154/106 | HR 88 | Temp 98.2°F | Ht 60.5 in | Wt 210.0 lb

## 2024-02-18 DIAGNOSIS — K219 Gastro-esophageal reflux disease without esophagitis: Secondary | ICD-10-CM

## 2024-02-18 DIAGNOSIS — E282 Polycystic ovarian syndrome: Secondary | ICD-10-CM

## 2024-02-18 DIAGNOSIS — Z6841 Body Mass Index (BMI) 40.0 and over, adult: Secondary | ICD-10-CM

## 2024-02-18 DIAGNOSIS — K8080 Other cholelithiasis without obstruction: Secondary | ICD-10-CM

## 2024-02-18 DIAGNOSIS — E669 Obesity, unspecified: Secondary | ICD-10-CM | POA: Diagnosis not present

## 2024-02-18 DIAGNOSIS — Z0289 Encounter for other administrative examinations: Secondary | ICD-10-CM

## 2024-02-18 DIAGNOSIS — I10 Essential (primary) hypertension: Secondary | ICD-10-CM | POA: Diagnosis not present

## 2024-02-18 DIAGNOSIS — R7303 Prediabetes: Secondary | ICD-10-CM

## 2024-02-29 ENCOUNTER — Other Ambulatory Visit (HOSPITAL_COMMUNITY): Payer: Self-pay

## 2024-03-03 ENCOUNTER — Ambulatory Visit (INDEPENDENT_AMBULATORY_CARE_PROVIDER_SITE_OTHER): Admitting: Family Medicine

## 2024-03-05 ENCOUNTER — Encounter: Admitting: Family Medicine

## 2024-03-11 ENCOUNTER — Ambulatory Visit (INDEPENDENT_AMBULATORY_CARE_PROVIDER_SITE_OTHER): Admitting: Nurse Practitioner

## 2024-03-11 ENCOUNTER — Encounter (INDEPENDENT_AMBULATORY_CARE_PROVIDER_SITE_OTHER): Payer: Self-pay | Admitting: Nurse Practitioner

## 2024-03-11 VITALS — BP 128/82 | HR 92 | Temp 98.9°F | Ht 60.0 in | Wt 208.0 lb

## 2024-03-11 DIAGNOSIS — K219 Gastro-esophageal reflux disease without esophagitis: Secondary | ICD-10-CM

## 2024-03-11 DIAGNOSIS — I10 Essential (primary) hypertension: Secondary | ICD-10-CM

## 2024-03-11 DIAGNOSIS — E7800 Pure hypercholesterolemia, unspecified: Secondary | ICD-10-CM | POA: Diagnosis not present

## 2024-03-11 DIAGNOSIS — Z6841 Body Mass Index (BMI) 40.0 and over, adult: Secondary | ICD-10-CM

## 2024-03-11 DIAGNOSIS — R7303 Prediabetes: Secondary | ICD-10-CM

## 2024-03-11 DIAGNOSIS — E559 Vitamin D deficiency, unspecified: Secondary | ICD-10-CM | POA: Diagnosis not present

## 2024-03-11 DIAGNOSIS — R5383 Other fatigue: Secondary | ICD-10-CM

## 2024-03-11 DIAGNOSIS — R0602 Shortness of breath: Secondary | ICD-10-CM | POA: Insufficient documentation

## 2024-03-11 DIAGNOSIS — Z1331 Encounter for screening for depression: Secondary | ICD-10-CM | POA: Diagnosis not present

## 2024-03-11 DIAGNOSIS — E66813 Obesity, class 3: Secondary | ICD-10-CM

## 2024-03-11 DIAGNOSIS — T733XXA Exhaustion due to excessive exertion, initial encounter: Secondary | ICD-10-CM | POA: Insufficient documentation

## 2024-03-11 NOTE — Progress Notes (Signed)
 [address] [place],  [place], [place] [address]  Office: [phone]  /  Fax: [phone]   Subjective   Initial Visit  Diamond Thompson (MR# [id number]) is a 38 y.o. female who presents for evaluation and treatment of obesity and related comorbidities. Current BMI is Body mass index is 40.62 kg/m. Diamond Thompson has been struggling with her weight for many years and has been unsuccessful in either losing weight, maintaining weight loss, or reaching her healthy weight goal.  Diamond Thompson is currently in the action stage of change and ready to dedicate time achieving and maintaining a healthier weight. Diamond Thompson is interested in becoming our patient and working on intensive lifestyle modifications including (but not limited to) diet and exercise for weight loss.  Diamond Thompson currently weighs 208 pounds, with a highest recorded weight of 215 pounds, and has a body fat percentage of 44%. Her goal is to lose approximately 40 pounds to reach a target weight of 170 pounds and reduce her body fat %. Diamond Thompson attributes her weight gain to the use of Depo-Provera  and a recent pregnancy loss when Diamond Thompson was about [redacted] weeks pregnant .   Diamond Thompson is taking nifedipine   60 mg PRN for hypertension. Diamond Thompson was encouraged to take her medication regularly. Denies headaches, chest pain, shortness of breath at rest and dizziness. Normal BP today BP Readings from Last 3 Encounters:  03/11/24 128/82  02/18/24 (!) 154/106  09/18/23 (!) 148/90    Diamond Thompson takes metformin  500 mg BID for prediabetes, which Diamond Thompson started due to elevated blood sugar levels, prediabetes.   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 Diamond Thompson skips meals at times and because Diamond Thompson is cooking all day, Diamond Thompson does not feel like cooking her own meal.    Weight history:  When asked how their weight has affected their life and health, Diamond Thompson states: Has affected self-esteem, Contributed to medical problems, Having  fatigue, and Having poor endurance  When asked what else they would like to accomplish? Diamond Thompson states: Adopt a healthier eating pattern and lifestyle, Improve energy levels and physical activity, Improve existing medical conditions, Improve quality of life, Improve appearance, Improve self-confidence, and Lose 40 lbs  Diamond Thompson starting to note weight gain during : adulthood around the age of 19  Life events associated with weight gain include : starting a medication depo around 18 to 19 gained 10 pounds.   Other contributing factors: disruption of circadian rhythm / sleep disordered breathing, consumption of processed foods, moderate to high levels of stress, reduced physical activity, chronic skipping of meals, strong orexigenic signaling and/or inadequate inhibitory control , slow metabolism for age, need for convenience due to lack of time, enticing relationships and enviroment, multiple weight loss attempts in the past, need for convenient foods, and self - critic or all-or-none mindset.  Their highest weight has been:  215 lbs.  Desired weight: 170  Previous weight-loss programs : Meal Replacements, Intermittent fasting, and High Protein.  Their maximum weight loss was:  10 lbs.  Their greatest challenge with dieting: increased appetite, low energy, and meal preparation and cooking.  Current or previous pharmacotherapy: None.  Response to medication: Never tried medications   Nutritional History:  Current nutrition plan: None.  How many times do you eat outside the home: 5-7 per week  How often do they skip meals: skips dinner maybe 2 times a week  What beverages do they drink: water, caffeinated beverages , regular soda , juice,  protein shakes , smoothies, sweet tea , and milk.   Use of artificial sweetners : No  Food intolerances or dislikes: raw fruits and vegetables  Food triggers: Stress, Boredom, and To help comfort self.  Food cravings: Sugary and Starches /  Carbohydrates  Do they struggle with excessive hunger or portion control : Yes    Physical Activity:  Current level of physical activity: NEAT  Barriers to Exercise: time, energy   Past medical history includes:   Past Medical History:  Diagnosis Date   Abnormal uterine bleeding (AUB) 01/13/2022   Allergy    Asthma    Bilateral swelling of feet    Cholelithiasis 05/28/2022   Depression screening 03/11/2024   Fibroid    Hypertension    Infrequent menses 2019   Lactose intolerance    Leg swelling    Missed abortion 2025   s/p D&E   Multiple food allergies    Neck mass 05/28/2022   PCOS (polycystic ovarian syndrome)    Pre-diabetes    Primary hypertension 12/05/2021   Shortness of breath    STD (sexually transmitted disease)    age 52?   Viral labyrinthitis of both ears 08/15/2022     Objective   BP 128/82   Pulse 92   Temp 98.9 F (37.2 C)   Ht 5' (1.524 m)   Wt 208 lb (94.3 kg)   LMP 10/29/2023 (Approximate) Comment: irregular cycles  SpO2 100%   BMI 40.62 kg/m  Diamond Thompson was weighed on the bioimpedance scale: Body mass index is 40.62 kg/m.    Anthropometrics:  Vitals Temp: 98.9 F (37.2 C) BP: 128/82 Pulse Rate: 92 SpO2: 100 %   Anthropometric Measurements Height: 5' (1.524 m) Weight: 208 lb (94.3 kg) BMI (Calculated): 40.62 Starting Weight: 208 lb Peak Weight: 215 lb   Body Composition  Body Fat %: 44.5 % Fat Mass (lbs): 93 lbs Muscle Mass (lbs): 110 lbs Total Body Water (lbs): 80 lbs Visceral Fat Rating : 12   Other Clinical Data Fasting: yes Labs: yes Today's Visit #: 1 Starting Date: 03/11/24    Physical Exam:  General: Diamond Thompson is overweight, cooperative, alert, well developed, and in no acute distress. PSYCH: Has normal mood, affect and thought process.   HEENT: EOMI, sclerae are anicteric. Lungs: Normal breathing effort, no conversational dyspnea. Extremities: No edema.  Neurologic: No gross sensory or motor deficits. No  tremors or fasciculations noted.    Diagnostic Data Reviewed  EKG: Normal sinus rhythm, rate 83. No conduction abnormalities, abnormal Q waves or chamber enlargement.  Indirect Calorimeter completed today shows a VO2 of 235 and a REE of 1627.  Her calculated basal metabolic rate is 8361 thus her resting energy expenditure same as calculated.  Depression Screen  Diamond Thompson's PHQ-9 score was: 5.     03/11/2024    9:12 AM  Depression screen PHQ 2/9  Decreased Interest 1  Down, Depressed, Hopeless 1  PHQ - 2 Score 2  Altered sleeping 0  Tired, decreased energy 1  Change in appetite 2  Feeling bad or failure about yourself  0  Trouble concentrating 0  Moving slowly or fidgety/restless 0  Suicidal thoughts 0  PHQ-9 Score 5  Difficult doing work/chores Not difficult at all    Screening for Sleep Related Breathing Disorders  Diamond Thompson admits to daytime somnolence and denies waking up still tired. Patient has a history of symptoms of daytime fatigue, Epworth sleepiness scale, morning headache, and hypertension. Diamond Thompson generally gets 7 hours of sleep  per night, and states that Diamond Thompson has generally restful sleep. Snoring is present. Apneic episodes are present. Epworth Sleepiness Score is 9.   Last metabolic panel Lab Results  Component Value Date   GLUCOSE 88 02/28/2023   NA 133 (L) 02/28/2023   K 3.0 (L) 02/28/2023   CL 105 02/28/2023   CO2 20 (L) 02/28/2023   BUN 5 (L) 02/28/2023   CREATININE 0.82 02/28/2023   GFRNONAA >60 02/28/2023   CALCIUM 8.7 (L) 02/28/2023   PROT 7.4 06/05/2022   ALBUMIN 4.0 06/05/2022   BILITOT 0.4 06/05/2022   ALKPHOS 109 06/05/2022   AST 17 06/05/2022   ALT 16 06/05/2022   ANIONGAP 8 02/28/2023     Lab Results  Component Value Date   HGBA1C 6.2 05/23/2022   HGBA1C 6.3 01/10/2022   Lab Results  Component Value Date   INSULIN  31.5 (H) 08/25/2022   CBC    Component Value Date/Time   WBC 5.9 09/18/2023 1358   RBC 5.18 (H) 09/18/2023 1358   HGB  12.7 09/18/2023 1358   HGB 11.6 10/31/2018 1016   HCT 40.1 09/18/2023 1358   HCT 37.5 10/31/2018 1016   PLT 388 09/18/2023 1358   PLT 460 (H) 10/31/2018 1016   MCV 77.4 (L) 09/18/2023 1358   MCV 80 10/31/2018 1016   MCH 24.5 (L) 09/18/2023 1358   MCHC 31.7 09/18/2023 1358   RDW 14.7 09/18/2023 1358   RDW 15.0 10/31/2018 1016    Lipid Panel     Component Value Date/Time   CHOL 162 05/23/2022 1418   TRIG 99.0 05/23/2022 1418   HDL 41.70 05/23/2022 1418   CHOLHDL 4 05/23/2022 1418   VLDL 19.8 05/23/2022 1418   LDLCALC 101 (H) 05/23/2022 1418   No results found for: VITAMINB12      Assessment and Plan   TREATMENT PLAN FOR OBESITY:  Recommended Dietary Goals  Yomira is currently in the action stage of change. As such, her goal is to implement medically supervised obesity management plan.  Diamond Thompson has agreed to implement: the Category 2 plan - 1200 kcal per day  Behavioral Intervention  We discussed the following Behavioral Modification Strategies today: increasing lean protein intake to established goals, decreasing simple carbohydrates , increasing vegetables, increasing lower glycemic fruits, increasing fiber rich foods, avoiding skipping meals, increasing water intake, work on meal planning and preparation, reading food labels , keeping healthy foods at home, identifying sources and decreasing liquid calories, decreasing eating out or consumption of processed foods, and making healthy choices when eating convenient foods, planning for success, and better snacking choices  Additional resources provided today: Handout on healthy eating and balanced plate, Handout on complex carbohydrates and lean sources of protein, Category 2 packet, and Handout principles of weight management  Recommended Physical Activity Goals  Diamond Thompson has been advised to work up to 150 minutes of moderate intensity aerobic activity a week and strengthening exercises 2-3 times per week for cardiovascular  health, weight loss maintenance and preservation of muscle mass.   Diamond Thompson has agreed to :  Think about enjoyable ways to increase daily physical activity and overcoming barriers to exercise and Increase physical activity in their day and reduce sedentary time (increase NEAT).  Medical Interventions and Pharmacotherapy We will work on building a therapist, art and behavioral strategies. We will discuss the role of pharmacotherapy as an adjunct at subsequent visits.   ASSOCIATED CONDITIONS ADDRESSED TODAY  Other Fatigue  Diamond Thompson does feel that her weight is causing her  energy to be lower than it should be. Fatigue may be related to obesity, depression or many other causes. Labs will be ordered, and in the meanwhile, Diamond Thompson will focus on self care including making healthy food choices, increasing physical activity and focusing on stress reduction. - EKG 12 lead  Shortness of Breath with exertion Diamond Thompson notes increasing shortness of breath with physical activity and seems to be worsening over time with weight gain. Diamond Thompson notes getting out of breath sooner with activity than Diamond Thompson used to. This has not gotten worse recently. Diamond Thompson denies shortness of breath at rest or orthopnea.  Primary hypertension Continue amlodipine  60 mg every day Start Category 2 meal plan  and DASH diet Monitor BP and if consistently >140/90 notify PCP If develops headaches, chest pain, shortness of breath or dizziness go to ER Continue to follow regularly with PCP -     CBC with Differential/Platelet -     Comprehensive metabolic panel with GFR  Prediabetes Start Category 2  meal plan, limit simple carbohydrates Decreasing body weight by 10-15% can improve glucose levels Continue Metformin  XR 75 mg every day Continue to follow regularly with PCP.  -     CBC with Differential/Platelet -     Comprehensive metabolic panel with GFR -     Hemoglobin A1c -     Insulin , random  Gastroesophageal reflux  disease, unspecified whether esophagitis present       Continue Omeprazole  20 mg every day       Continue nutrition and behavior modification   Depression screening       Diamond Thompson score a 5 on PHQ9, no further need for intervention       Continue to monitor for symptoms  Elevated LDL cholesterol level Focus on implementing category 2 meal plan, limit saturated fats Loss of 10-15% body weight can improve lipid levels Continue to follow regularly with PCP  Class 3 severe obesity with serious comorbidity and body mass index (BMI) of 40.0 to 44.9 in adult, unspecified obesity type (HCC) See plan above -     CBC with Differential/Platelet -     Comprehensive metabolic panel with GFR -     Hemoglobin A1c -     Insulin , random -     Lipid panel -     Magnesium -     TSH -     Vitamin B12 -     VITAMIN D  25 Hydroxy (Vit-D Deficiency, Fractures)  Vitamin D  deficiency -     VITAMIN D  25 Hydroxy (Vit-D Deficiency, Fractures)    Follow-up  Diamond Thompson was informed of the importance of frequent follow-up visits to maximize her success with intensive lifestyle modifications for her multiple health conditions. Diamond Thompson was informed we would discuss her lab results at her next visit unless there is a critical issue that needs to be addressed sooner. Diamond Thompson agreed to keep her next visit at the agreed upon time to discuss these results.  Attestation Statement  This is the patient's intake visit at [facility]. The patient's Health Questionnaire was reviewed at length. Included in the packet: current and past health history, medications, allergies, ROS, gynecologic history (women only), surgical history, family history, social history, weight history, weight loss surgery history (for those that have had weight loss surgery), nutritional evaluation, mood and food questionnaire, PHQ9, Epworth questionnaire, sleep habits questionnaire, patient life and health improvement goals questionnaire. These  will all be scanned into the patient's chart under media.   During  the visit, I independently reviewed the patient's EKG, previous labs, bioimpedance scale results, and indirect calorimetry results. I used this information to medically tailor a meal plan for the patient that will help her to lose weight and will improve her obesity-related conditions. I performed a medically necessary appropriate examination and/or evaluation. I discussed the assessment and treatment plan with the patient. The patient was provided an opportunity to ask questions and all were answered. The patient agreed with the plan and demonstrated an understanding of the instructions. Labs were ordered at this visit and will be reviewed at the next visit unless critical results need to be addressed immediately. Clinical information was updated and documented in the EMR.   In addition, they received basic education on identification of processed foods and reduction of these, different sources of lean proteins and complex carbohydrates and how to eat balanced by incorporation of whole foods.  Reviewed by clinician on day of visit: allergies, medications, problem list, medical history, surgical history, family history, social history, and previous encounter notes.  I personally spent a total of 40 minutes in the care of the patient today including preparing to see the patient, getting/reviewing separately obtained history, performing a medically appropriate exam/evaluation, counseling and educating, placing orders, and documenting clinical information in the EHR. Does not include time spent on depression screening, EKG or indirect calorimetry.    Diamond Thompson ANP-C

## 2024-03-12 ENCOUNTER — Ambulatory Visit (INDEPENDENT_AMBULATORY_CARE_PROVIDER_SITE_OTHER): Payer: Self-pay | Admitting: Nurse Practitioner

## 2024-03-12 DIAGNOSIS — K760 Fatty (change of) liver, not elsewhere classified: Secondary | ICD-10-CM | POA: Insufficient documentation

## 2024-03-12 LAB — COMPREHENSIVE METABOLIC PANEL WITH GFR
ALT: 19 IU/L (ref 0–32)
AST: 21 IU/L (ref 0–40)
Albumin: 4.4 g/dL (ref 3.9–4.9)
Alkaline Phosphatase: 153 IU/L — ABNORMAL HIGH (ref 41–116)
BUN/Creatinine Ratio: 9 (ref 9–23)
BUN: 7 mg/dL (ref 6–20)
Bilirubin Total: 0.3 mg/dL (ref 0.0–1.2)
CO2: 24 mmol/L (ref 20–29)
Calcium: 9.2 mg/dL (ref 8.7–10.2)
Chloride: 100 mmol/L (ref 96–106)
Creatinine, Ser: 0.81 mg/dL (ref 0.57–1.00)
Globulin, Total: 3.4 g/dL (ref 1.5–4.5)
Glucose: 87 mg/dL (ref 70–99)
Potassium: 4.2 mmol/L (ref 3.5–5.2)
Sodium: 139 mmol/L (ref 134–144)
Total Protein: 7.8 g/dL (ref 6.0–8.5)
eGFR: 95 mL/min/1.73 (ref >59)

## 2024-03-12 LAB — VITAMIN B12: Vitamin B-12: 1095 pg/mL (ref 232–1245)

## 2024-03-12 LAB — CBC WITH DIFFERENTIAL/PLATELET
Basophils Absolute: 0 x10E3/uL (ref 0.0–0.2)
Basos: 0 %
EOS (ABSOLUTE): 0.1 x10E3/uL (ref 0.0–0.4)
Eos: 1 %
Hematocrit: 46.7 % — ABNORMAL HIGH (ref 34.0–46.6)
Hemoglobin: 14.6 g/dL (ref 11.1–15.9)
Immature Grans (Abs): 0 x10E3/uL (ref 0.0–0.1)
Immature Granulocytes: 0 %
Lymphocytes Absolute: 2 x10E3/uL (ref 0.7–3.1)
Lymphs: 34 %
MCH: 24.6 pg — ABNORMAL LOW (ref 26.6–33.0)
MCHC: 31.3 g/dL — ABNORMAL LOW (ref 31.5–35.7)
MCV: 79 fL (ref 79–97)
Monocytes Absolute: 0.4 x10E3/uL (ref 0.1–0.9)
Monocytes: 6 %
Neutrophils Absolute: 3.4 x10E3/uL (ref 1.4–7.0)
Neutrophils: 59 %
Platelets: 424 x10E3/uL (ref 150–450)
RBC: 5.93 x10E6/uL — ABNORMAL HIGH (ref 3.77–5.28)
RDW: 15.1 % (ref 11.7–15.4)
WBC: 5.9 x10E3/uL (ref 3.4–10.8)

## 2024-03-12 LAB — HEMOGLOBIN A1C
Est. average glucose Bld gHb Est-mCnc: 123 mg/dL
Hgb A1c MFr Bld: 5.9 % — ABNORMAL HIGH (ref 4.8–5.6)

## 2024-03-12 LAB — INSULIN, RANDOM: INSULIN: 32.8 u[IU]/mL — ABNORMAL HIGH (ref 2.6–24.9)

## 2024-03-12 LAB — LIPID PANEL
Chol/HDL Ratio: 3.9 ratio (ref 0.0–4.4)
Cholesterol, Total: 186 mg/dL (ref 100–199)
HDL: 48 mg/dL (ref >39)
LDL Chol Calc (NIH): 121 mg/dL — ABNORMAL HIGH (ref 0–99)
Triglycerides: 95 mg/dL (ref 0–149)
VLDL Cholesterol Cal: 17 mg/dL (ref 5–40)

## 2024-03-12 LAB — TSH: TSH: 1.37 u[IU]/mL (ref 0.450–4.500)

## 2024-03-12 LAB — MAGNESIUM: Magnesium: 2.2 mg/dL (ref 1.6–2.3)

## 2024-03-12 LAB — VITAMIN D 25 HYDROXY (VIT D DEFICIENCY, FRACTURES): Vit D, 25-Hydroxy: 22.8 ng/mL — ABNORMAL LOW (ref 30.0–100.0)

## 2024-03-25 ENCOUNTER — Other Ambulatory Visit (HOSPITAL_COMMUNITY): Payer: Self-pay

## 2024-03-25 ENCOUNTER — Ambulatory Visit (INDEPENDENT_AMBULATORY_CARE_PROVIDER_SITE_OTHER): Admitting: Nurse Practitioner

## 2024-03-25 ENCOUNTER — Encounter (INDEPENDENT_AMBULATORY_CARE_PROVIDER_SITE_OTHER): Payer: Self-pay | Admitting: Nurse Practitioner

## 2024-03-25 VITALS — BP 136/83 | HR 98 | Temp 98.9°F | Ht 60.0 in | Wt 208.0 lb

## 2024-03-25 DIAGNOSIS — E7800 Pure hypercholesterolemia, unspecified: Secondary | ICD-10-CM | POA: Diagnosis not present

## 2024-03-25 DIAGNOSIS — E559 Vitamin D deficiency, unspecified: Secondary | ICD-10-CM

## 2024-03-25 DIAGNOSIS — I10 Essential (primary) hypertension: Secondary | ICD-10-CM | POA: Diagnosis not present

## 2024-03-25 DIAGNOSIS — Z6841 Body Mass Index (BMI) 40.0 and over, adult: Secondary | ICD-10-CM | POA: Diagnosis not present

## 2024-03-25 DIAGNOSIS — E66813 Obesity, class 3: Secondary | ICD-10-CM | POA: Diagnosis not present

## 2024-03-25 DIAGNOSIS — R7303 Prediabetes: Secondary | ICD-10-CM | POA: Diagnosis not present

## 2024-03-25 MED ORDER — VITAMIN D (ERGOCALCIFEROL) 1.25 MG (50000 UNIT) PO CAPS
50000.0000 [IU] | ORAL_CAPSULE | ORAL | 1 refills | Status: DC
  Filled 2024-03-25: qty 5, 35d supply, fill #0

## 2024-03-25 NOTE — Progress Notes (Addendum)
 Office: [phone]  /  Fax: [phone]  WEIGHT SUMMARY AND BIOMETRICS  Weight Lost Since Last Visit: 0  Weight Gained Since Last Visit: 0   Vitals Temp: 98.9 F (37.2 C) BP: 136/83 Pulse Rate: 98 SpO2: 99 %   Anthropometric Measurements Height: 5' (1.524 m) Weight: 208 lb (94.3 kg) BMI (Calculated): 40.62 Weight at Last Visit: 208 lb Weight Lost Since Last Visit: 0 Weight Gained Since Last Visit: 0 Starting Weight: 208 lb Total Weight Loss (lbs): 0 lb (0 kg) Peak Weight: 215 lb Waist Measurement : 42 inches   Body Composition  Body Fat %: 44.7 % Fat Mass (lbs): 93.2 lbs Muscle Mass (lbs): 109.6 lbs Total Body Water (lbs): 86.8 lbs Visceral Fat Rating : 12   Other Clinical Data Fasting: no Labs: no Today's Visit #: 2 Starting Date: 03/11/24    Total Weight Loss: Percent of body weight lost:   Bio Impedance Data reviewed with patient:  HPI  Chief Complaint: OBESITY  Diamond Thompson is here to discuss her progress with her obesity treatment plan. She is on the the Category 2 Plan and states she is following her eating plan approximately 25 % of the time. She states she is not currently exercising   Interval History:  Since last office visit she has been getting 100 grams of protein most days.  She will occasionally skip lunch and sometimes will skip breakfast as well. She is drinking at least 64 ounces of water. She is getting vegetables, has not been getting many fruits.  Thanksgiving went well.  She did not over eat, control portions.  She works in [employer] at [employer]. She has not been tempted by sweets.  Will make first exercise goal as 20 minutes of walking 2 days a week   Julieth does have hypertension and his BP's are currently well controlled on nifedipine  60 mg every day. Denies headaches, chest pain shortness of breath at rest and dizziness  BP Readings from Last 3 Encounters:  03/25/24 136/83  03/11/24 128/82  02/18/24 (!) 154/106     Lab results are reviewed with patient in detail. Electrolytes, kidney function , Vit B12 and thyroid  are all normal. CBC is stable. Vit D is low and would benefit from supplementation. Fasting Insulin  is elevated at 32.8 with HOMA-IR score of 7.05 indicating insulin  resistance. A1c is 5.9 indicating prediabetes- she is currently on Metformin  500 mg BID. Elevated LDL cholesterol- currently on no medication. Elevated alkaline phosphatase with normal ALT and AST. Abdominal U/S 2023 liver had normal appearance   PHYSICAL EXAM:  Blood pressure 136/83, pulse 98, temperature 98.9 F (37.2 C), height 5' (1.524 m), weight 208 lb (94.3 kg), last menstrual period 10/29/2023, SpO2 99%. Body mass index is 40.62 kg/m.  General: Well Developed, well nourished, and in no acute distress.  HEENT: Normocephalic, atraumatic; EOMI, sclerae are anicteric. Skin: Warm and dry, good turgor Chest:  Normal excursion, shape, no gross ABN Respiratory: No conversational dyspnea; speaking in full sentences NeuroM-Sk:  Normal gross ROM * 4 extremities  Psych: A and O X 3, insight adequate, mood- full    DIAGNOSTIC DATA REVIEWED:  BMET    Component Value Date/Time   NA 139 03/11/2024 0943   K 4.2 03/11/2024 0943   CL 100 03/11/2024 0943   CO2 24 03/11/2024 0943   GLUCOSE 87 03/11/2024 0943   GLUCOSE 88 02/28/2023 1135   BUN 7 03/11/2024 0943   CREATININE 0.81 03/11/2024 0943   CALCIUM 9.2 03/11/2024 0943  GFRNONAA >60 02/28/2023 1135   Lab Results  Component Value Date   HGBA1C 5.9 (H) 03/11/2024   HGBA1C 6.3 01/10/2022   Lab Results  Component Value Date   INSULIN  32.8 (H) 03/11/2024   INSULIN  31.5 (H) 08/25/2022   Lab Results  Component Value Date   TSH 1.370 03/11/2024   CBC    Component Value Date/Time   WBC 5.9 03/11/2024 0943   WBC 5.9 09/18/2023 1358   RBC 5.93 (H) 03/11/2024 0943   RBC 5.18 (H) 09/18/2023 1358   HGB 14.6 03/11/2024 0943   HCT 46.7 (H) 03/11/2024 0943   PLT 424  03/11/2024 0943   MCV 79 03/11/2024 0943   MCH 24.6 (L) 03/11/2024 0943   MCH 24.5 (L) 09/18/2023 1358   MCHC 31.3 (L) 03/11/2024 0943   MCHC 31.7 09/18/2023 1358   RDW 15.1 03/11/2024 0943   Iron Studies No results found for: IRON, TIBC, FERRITIN, IRONPCTSAT Lipid Panel     Component Value Date/Time   CHOL 186 03/11/2024 0943   TRIG 95 03/11/2024 0943   HDL 48 03/11/2024 0943   CHOLHDL 3.9 03/11/2024 0943   CHOLHDL 4 05/23/2022 1418   VLDL 19.8 05/23/2022 1418   LDLCALC 121 (H) 03/11/2024 0943   Hepatic Function Panel     Component Value Date/Time   PROT 7.8 03/11/2024 0943   ALBUMIN 4.4 03/11/2024 0943   AST 21 03/11/2024 0943   ALT 19 03/11/2024 0943   ALKPHOS 153 (H) 03/11/2024 0943   BILITOT 0.3 03/11/2024 0943      Component Value Date/Time   TSH 1.370 03/11/2024 0943   Nutritional Lab Results  Component Value Date   VD25OH 22.8 (L) 03/11/2024     ASSESSMENT AND PLAN  Class 3 severe obesity with serious comorbidity and body mass index (BMI) of 40.0 to 44.9 in adult, unspecified obesity type (HCC) TREATMENT PLAN FOR OBESITY:  Recommended Dietary Goals  [name] is currently in the action stage of change. As such, her goal is to continue weight management plan. She has agreed to the Category 2 Plan.  Behavioral Intervention  We discussed the following Behavioral Modification Strategies today: increasing lean protein intake to established goals, increasing fiber rich foods, avoiding skipping meals, work on meal planning and preparation, planning for success, celebration eating strategies, continue to work on maintaining a reduced calorie state, getting the recommended amount of protein, incorporating whole foods, making healthy choices, staying well hydrated and practicing mindfulness when eating., and increase protein intake, fibrous foods (25 grams per day for women, 30 grams for men) and water to improve satiety and decrease hunger signals. .  She  plans to maintain her weight in December - She wants to have small indulgences, but she is not going to waste her calories on things that are not wonderful. - Outside of social situations she will continue to follow a structured eating plan but enjoy herself with portion control for holiday events, parties and get-togethers - She does recognize that this strategy is to help her maintain her weight, not lose weight and in January she will return to a structured plan for weight loss   Recommended Physical Activity Goals  [place] has been advised to work up to 150 minutes of moderate intensity aerobic activity a week and strengthening exercises 2-3 times per week for cardiovascular health, weight loss maintenance and preservation of muscle mass.   She has agreed to Patient will begin to exercise walking 20 minutes 2 days a week as  her first goal.   Pharmacotherapy We discussed various medication options to help Varina with her weight loss efforts and we both agreed to start ergocalciferol  50000 units once a week for Vit D deficiency.  ASSOCIATED CONDITIONS ADDRESSED TODAY  Action/Plan  Primary hypertension Continue nifedipine  60 mg  every day Continue Category 2 meal plan  and DASH diet Monitor BP and if consistently >140/90 notify PCP If develops headaches, chest pain, shortness of breath or dizziness go to ER Continue to follow regularly with PCP  Prediabetes Continue Category 2  meal plan, limit simple carbohydrates. Increase lean protein, fiber and water.  Avoid skipping meals Continue Metformin  500 mg BID- denies side effects Continue to follow regularly with PCP Start exercise with goal of walking 20 minutes 2 days a week  Elevated LDL cholesterol level Focus on implementing category 2 meal plan, limit saturated fats- reviewed saturated fats in detail and given written information on lowering cholesterol Loss of 10-15% body weight can improve lipid levels Start exercise with goal  of walking 20 minutes 2 days a week   Vitamin D  deficiency Supplement with Ergocalciferol  50000 units once a week  Low vitamin D  levels can be associated with adiposity and may result in leptin resistance and weight gain. Also associated with fatigue.   -     Vitamin D  (Ergocalciferol ); Take 1 capsule (50,000 Units total) by mouth every 7 (seven) days.  Dispense: 5 capsule; Refill: 1       Return in about 4 weeks (around 04/22/2024).SABRA She was informed of the importance of frequent follow up visits to maximize her success with intensive lifestyle modifications for her multiple health conditions.   ATTESTASTION STATEMENTS:  Reviewed by clinician on day of visit: allergies, medications, problem list, medical history, surgical history, family history, social history, and previous encounter notes.     Anushka Hartinger ANP-C

## 2024-03-25 NOTE — Patient Instructions (Signed)
 Do NOT SKIP MEALS- set an alarm for 6 hours and do that after each meal to remind yourself to eat the next meal Start exercise- walking 20 minutes 2 days a week Start Ergocalciferol (Vit D) 50000 units once a week Continue Metformin    Preventing High Cholesterol Cholesterol is a white, waxy substance similar to fat that the human body needs to help build cells. The liver makes all the cholesterol that a person's body needs. Having high cholesterol (hypercholesterolemia) increases your risk for heart disease and stroke. Extra or excess cholesterol comes from the food that you eat. High cholesterol can often be prevented with diet and lifestyle changes. If you already have high cholesterol, you can control it with diet, lifestyle changes, and medicines. How can high cholesterol affect me? If you have high cholesterol, fatty deposits (plaques) may build up on the walls of your blood vessels. The blood vessels that carry blood away from your heart are called arteries. Plaques make the arteries narrower and stiffer. This in turn can: Restrict or block blood flow and cause blood clots to form. Increase your risk for heart attack and stroke. What can increase my risk for high cholesterol? This condition is more likely to develop in people who: Eat foods that are high in saturated fat or cholesterol. Saturated fat is mostly found in foods that come from animal sources. Are overweight. Are not getting enough exercise. Use products that contain nicotine or tobacco, such as cigarettes, e-cigarettes, and chewing tobacco. Have a family history of high cholesterol (familial hypercholesterolemia). What actions can I take to prevent this? Nutrition  Eat less saturated fat. Avoid trans fats (partially hydrogenated oils). These are often found in margarine and in some baked goods, fried foods, and snacks bought in packages. Avoid precooked or cured meat, such as bacon, sausages, or meat loaves. Avoid foods  and drinks that have added sugars. Eat more fruits, vegetables, and whole grains. Choose healthy sources of protein, such as fish, poultry, lean cuts of red meat, beans, peas, lentils, and nuts. Choose healthy sources of fat, such as: Nuts. Vegetable oils, especially olive oil. Fish that have healthy fats, such as omega-3 fatty acids. These fish include mackerel or salmon. Lifestyle Lose weight if you are overweight. Maintaining a healthy body mass index (BMI) can help prevent or control high cholesterol. It can also lower your risk for diabetes and high blood pressure. Ask your health care provider to help you with a diet and exercise plan to lose weight safely. Do not use any products that contain nicotine or tobacco. These products include cigarettes, chewing tobacco, and vaping devices, such as e-cigarettes. If you need help quitting, ask your health care provider. Alcohol use Do not drink alcohol if: Your health care provider tells you not to drink. You are pregnant, may be pregnant, or are planning to become pregnant. If you drink alcohol: Limit how much you have to: 0-1 drink a day for women. 0-2 drinks a day for men. Know how much alcohol is in your drink. In the [place] one drink equals one 12 oz bottle of beer (355 mL), one 5 oz glass of wine (148 mL), or one 1 oz glass of hard liquor (44 mL). Activity  Get enough exercise. Do exercises as told by your health care provider. Each week, do at least 150 minutes of exercise that takes a medium level of effort (moderate-intensity exercise). This kind of exercise: Makes your heart beat faster while allowing you to still be able to  talk. Can be done in short sessions several times a day or longer sessions a few times a week. For example, on 5 days each week, you could walk fast or ride your bike 3 times a day for 10 minutes each time. Medicines Your health care provider may recommend medicines to help lower cholesterol. This may be a  medicine to lower the amount of cholesterol that your liver makes. You may need medicine if: Diet and lifestyle changes have not lowered your cholesterol enough. You have high cholesterol and other risk factors for heart disease or stroke. Take over-the-counter and prescription medicines only as told by your health care provider. General information Manage your risk factors for high cholesterol. Talk with your health care provider about all your risk factors and how to lower your risk. Manage other conditions that you have, such as diabetes or high blood pressure (hypertension). Have blood tests to check your cholesterol levels at regular points in time as told by your health care provider. Keep all follow-up visits. This is important. Where to find more information American Heart Association: www.heart.org National Heart, Lung, and Blood Institute: popsteam.is Summary High cholesterol increases your risk for heart disease and stroke. By keeping your cholesterol level low, you can reduce your risk for these conditions. High cholesterol can often be prevented with diet and lifestyle changes. Work with your health care provider to manage your risk factors, and have your blood tested regularly. This information is not intended to replace advice given to you by your health care provider. Make sure you discuss any questions you have with your health care provider. Document Revised: 10/28/2021 Document Reviewed: 05/31/2020 Elsevier Patient Education  2024 [employer].

## 2024-04-23 ENCOUNTER — Encounter (INDEPENDENT_AMBULATORY_CARE_PROVIDER_SITE_OTHER): Payer: Self-pay | Admitting: Nurse Practitioner

## 2024-04-23 ENCOUNTER — Other Ambulatory Visit (HOSPITAL_COMMUNITY): Payer: Self-pay

## 2024-04-23 ENCOUNTER — Other Ambulatory Visit: Payer: Self-pay

## 2024-04-23 ENCOUNTER — Ambulatory Visit (INDEPENDENT_AMBULATORY_CARE_PROVIDER_SITE_OTHER): Admitting: Nurse Practitioner

## 2024-04-23 VITALS — BP 132/86 | HR 95 | Temp 98.4°F | Ht 60.0 in | Wt 204.0 lb

## 2024-04-23 DIAGNOSIS — Z6841 Body Mass Index (BMI) 40.0 and over, adult: Secondary | ICD-10-CM

## 2024-04-23 DIAGNOSIS — E559 Vitamin D deficiency, unspecified: Secondary | ICD-10-CM | POA: Diagnosis not present

## 2024-04-23 DIAGNOSIS — R7303 Prediabetes: Secondary | ICD-10-CM | POA: Diagnosis not present

## 2024-04-23 DIAGNOSIS — I10 Essential (primary) hypertension: Secondary | ICD-10-CM

## 2024-04-23 DIAGNOSIS — E66813 Obesity, class 3: Secondary | ICD-10-CM

## 2024-04-23 MED ORDER — NIFEDIPINE ER OSMOTIC RELEASE 60 MG PO TB24: 60.0000 mg | ORAL_TABLET | Freq: Every day | ORAL | Status: DC

## 2024-04-23 MED ORDER — VITAMIN D (ERGOCALCIFEROL) 1.25 MG (50000 UNIT) PO CAPS
50000.0000 [IU] | ORAL_CAPSULE | ORAL | 0 refills | Status: AC
  Filled 2024-04-23 – 2024-05-06 (×2): qty 5, 35d supply, fill #0

## 2024-04-23 NOTE — Progress Notes (Signed)
 " Office: [phone]  /  Fax: [phone]  WEIGHT SUMMARY AND BIOMETRICS  Weight Lost Since Last Visit: 4 lb  Weight Gained Since Last Visit: 0   Vitals Temp: 98.4 F (36.9 C) BP: 132/86 Pulse Rate: 95 SpO2: 100 %   Anthropometric Measurements Height: 5' (1.524 m) Weight: 204 lb (92.5 kg) BMI (Calculated): 39.84 Weight at Last Visit: 208 lb Weight Lost Since Last Visit: 4 lb Weight Gained Since Last Visit: 0 Starting Weight: 208 lb Total Weight Loss (lbs): 4 lb (1.814 kg) Peak Weight: 215 Waist Measurement : 42 inches   Body Composition  Body Fat %: 42.8 % Fat Mass (lbs): 876 lbs Muscle Mass (lbs): 111.2 lbs Total Body Water (lbs): 83.6 lbs Visceral Fat Rating : 11   Other Clinical Data Fasting: no Labs: no Today's Visit #: 3 Starting Date: 03/11/24    Total Weight Loss: 4 pounds  Bio Impedance Data reviewed with patient: Muscle is up 1.6 pounds and adipose is down 5.6 pounds.  Visceral fat rating is down 1 point from 12 to 11.   HPI  Chief Complaint: OBESITY  Clyda is here to discuss her progress with her obesity treatment plan. She is on the the Category 2 Plan and states she is following her eating plan approximately 50 % of the time. She states she is exercising 20 minutes 2 days per week.   Interval History:  Since last office visit she is trying to get 100 grams of protein daily.  If she is not going to get breakfast she will do a protein shake. She is getting 32 ounces of water. She is still drinking sweet tea- 2 a day. She is trying not to skip meals but if she does she will skip lunch. She has started walking 20 minutes 2 times a week. Christmas went well- no overeating.  She essentially stuck to plan She had initially set an alarm on her phone for every 6 hours to remember to eat lunch and dinner and was doing good so stopped, then was forgetting to eat lunch.  Leyana does have hypertension and her BP's are currently well controlled on  Procardia  60 mg every day. Denies headaches, chest pain shortness of breath at rest and dizziness  BP Readings from Last 3 Encounters:  04/23/24 132/86  03/25/24 136/83  03/11/24 128/82    [place] has insulin  resistance and prediabetes. She is currently on Metformin  500 mg BID and denies side effects.  She continues to work on nutrition and weight loss to help lower glucose/insulin  levels. Lab Results  Component Value Date   HGBA1C 5.9 (H) 03/11/2024    She was started on Ergocalciferol  50000 units once a week for Vit D deficiency and denies side effects with the medication. Last vitamin D  Lab Results  Component Value Date   VD25OH 22.8 (L) 03/11/2024     PHYSICAL EXAM:  Blood pressure 132/86, pulse 95, temperature 98.4 F (36.9 C), height 5' (1.524 m), weight 204 lb (92.5 kg), last menstrual period 06/29/2023, SpO2 100%. Body mass index is 39.84 kg/m.  General: Well Developed, well nourished, and in no acute distress.  HEENT: Normocephalic, atraumatic; EOMI, sclerae are anicteric. Skin: Warm and dry, good turgor Chest:  Normal excursion, shape, no gross ABN Respiratory: No conversational dyspnea; speaking in full sentences NeuroM-Sk:  Normal gross ROM * 4 extremities  Psych: A and O X 3, insight adequate, mood- full     ASSESSMENT AND PLAN Class 3 severe obesity with serious comorbidity  and body mass index (BMI) of 40.0 to 44.9 in adult, unspecified obesity type (HCC) TREATMENT PLAN FOR OBESITY:  Recommended Dietary Goals  Nataliee is currently in the action stage of change. As such, her goal is to continue weight management plan. She has agreed to the Category 2 Plan.  Behavioral Intervention  We discussed the following Behavioral Modification Strategies today: increasing lean protein intake to established goals, avoiding skipping meals, increasing water intake , work on meal planning and preparation, continue to work on maintaining a reduced calorie state, getting the  recommended amount of protein, incorporating whole foods, making healthy choices, staying well hydrated and practicing mindfulness when eating., and increase protein intake, fibrous foods (25 grams per day for women, 30 grams for men) and water to improve satiety and decrease hunger signals. .    Recommended Physical Activity Goals  Meline has been advised to work up to 150 minutes of moderate intensity aerobic activity a week and strengthening exercises 2-3 times per week for cardiovascular health, weight loss maintenance and preservation of muscle mass.   She has agreed to Increase physical activity in their day and reduce sedentary time (increase NEAT). and Increase volume of physical activity to a goal of 20 minutes 3 days a week   Pharmacotherapy We discussed various medication options to help Mialynn with her weight loss efforts and we both agreed to continue Ergocalciferol  50000 units once a week for Vit D deficiency- denies side effects from medication.  ASSOCIATED CONDITIONS ADDRESSED TODAY  Action/Plan  Primary hypertension Continue nifedipine  60 mg every day Continue Category 2 meal plan  and DASH diet Monitor BP and if consistently >140/90 notify PCP If develops headaches, chest pain, shortness of breath or dizziness go to ER Continue to follow regularly with PCP  Prediabetes Continue Category 2  meal plan, limit simple carbohydrates. Increase lean protein, fiber and water Decreasing body weight by 10-15% can improve glucose levels Continue exercise with current goal of 20 minutes 3 days a week  Vitamin D  deficiency Supplement with Ergocalciferol  50000 units once a week  Low vitamin D  levels can be associated with adiposity and may result in leptin resistance and weight gain. Also associated with fatigue.  Currently on vitamin D  supplementation without any adverse effects such as nausea, vomiting or muscle weakness.   -     Vitamin D  (Ergocalciferol ); Take 1 capsule  (50,000 Units total) by mouth every 7 (seven) days.  Dispense: 5 capsule; Refill: 0       Return in about 4 weeks (around 05/21/2024).SABRA She was informed of the importance of frequent follow up visits to maximize her success with intensive lifestyle modifications for her multiple health conditions.   ATTESTASTION STATEMENTS:  Reviewed by clinician on day of visit: allergies, medications, problem list, medical history, surgical history, family history, social history, and previous encounter notes.     Lonell Liverpool ANP-C "

## 2024-05-05 ENCOUNTER — Other Ambulatory Visit (HOSPITAL_COMMUNITY): Payer: Self-pay

## 2024-05-06 ENCOUNTER — Other Ambulatory Visit (HOSPITAL_COMMUNITY): Payer: Self-pay

## 2024-05-06 ENCOUNTER — Telehealth: Payer: Self-pay

## 2024-05-06 DIAGNOSIS — I10 Essential (primary) hypertension: Secondary | ICD-10-CM

## 2024-05-06 NOTE — Telephone Encounter (Signed)
 Refill request received for NIFEdipine  (PROCARDIA  XL) 60 MG 24 hr tablet  FOV: none LOV:05/31/23 Last refill: 04/23/24 Medication is pending your approval. Patient is not due for refill

## 2024-05-06 NOTE — Telephone Encounter (Signed)
 Copied from CRM #[id number]. Topic: Clinical - Medication Refill >> May 06, 2024  4:45 PM Roselie C wrote: Medication: NIFEdipine  (PROCARDIA  XL) 60 MG 24 hr tablet  Has the patient contacted their pharmacy? Yes (Agent: If no, request that the patient contact the pharmacy for the refill. If patient does not wish to contact the pharmacy document the reason why and proceed with request.) (Agent: If yes, when and what did the pharmacy advise?)  This is the patient's preferred pharmacy:  [facility] [address] [place] [address] Phone: [phone] Fax: [phone]    Is this the correct pharmacy for this prescription? Yes If no, delete pharmacy and type the correct one.   Has the prescription been filled recently? No  Is the patient out of the medication? No  Has the patient been seen for an appointment in the last year OR does the patient have an upcoming appointment? Yes  Can we respond through MyChart? Yes  Agent: Please be advised that Rx refills may take up to 3 business days. We ask that you follow-up with your pharmacy.

## 2024-05-07 ENCOUNTER — Other Ambulatory Visit (HOSPITAL_COMMUNITY): Payer: Self-pay

## 2024-05-07 ENCOUNTER — Other Ambulatory Visit: Payer: Self-pay

## 2024-05-07 MED ORDER — NIFEDIPINE ER OSMOTIC RELEASE 60 MG PO TB24
60.0000 mg | ORAL_TABLET | Freq: Every day | ORAL | 3 refills | Status: AC
  Filled 2024-05-07: qty 90, 90d supply, fill #0

## 2024-05-21 ENCOUNTER — Ambulatory Visit (INDEPENDENT_AMBULATORY_CARE_PROVIDER_SITE_OTHER): Admitting: Nurse Practitioner
